# Patient Record
Sex: Female | Born: 1960 | Race: White | Hispanic: No | Marital: Married | Smoking: Current every day smoker
Health system: Southern US, Community
[De-identification: ages and names within clinical notes are randomized; demographics above are authoritative.]

## PROBLEM LIST (undated history)

## (undated) DIAGNOSIS — T7840XA Allergy, unspecified, initial encounter: Secondary | ICD-10-CM

## (undated) DIAGNOSIS — E785 Hyperlipidemia, unspecified: Secondary | ICD-10-CM

## (undated) DIAGNOSIS — F17200 Nicotine dependence, unspecified, uncomplicated: Secondary | ICD-10-CM

## (undated) DIAGNOSIS — I10 Essential (primary) hypertension: Secondary | ICD-10-CM

## (undated) DIAGNOSIS — Z8679 Personal history of other diseases of the circulatory system: Secondary | ICD-10-CM

## (undated) DIAGNOSIS — N200 Calculus of kidney: Secondary | ICD-10-CM

## (undated) HISTORY — DX: Hyperlipidemia, unspecified: E78.5

## (undated) HISTORY — DX: Essential (primary) hypertension: I10

## (undated) HISTORY — PX: INDUCED ABORTION: SHX677

## (undated) HISTORY — PX: ENDOMETRIAL ABLATION: SHX621

## (undated) HISTORY — DX: Allergy, unspecified, initial encounter: T78.40XA

## (undated) HISTORY — PX: OTHER SURGICAL HISTORY: SHX169

## (undated) HISTORY — DX: Personal history of other diseases of the circulatory system: Z86.79

## (undated) HISTORY — DX: Nicotine dependence, unspecified, uncomplicated: F17.200

---

## 1998-07-13 ENCOUNTER — Emergency Department (HOSPITAL_COMMUNITY): Admission: EM | Admit: 1998-07-13 | Discharge: 1998-07-13 | Payer: Self-pay | Admitting: Emergency Medicine

## 1999-06-15 ENCOUNTER — Encounter: Payer: Self-pay | Admitting: Internal Medicine

## 1999-06-15 ENCOUNTER — Emergency Department (HOSPITAL_COMMUNITY): Admission: EM | Admit: 1999-06-15 | Discharge: 1999-06-16 | Payer: Self-pay | Admitting: Internal Medicine

## 2000-12-25 ENCOUNTER — Emergency Department (HOSPITAL_COMMUNITY): Admission: EM | Admit: 2000-12-25 | Discharge: 2000-12-25 | Payer: Self-pay | Admitting: Emergency Medicine

## 2000-12-25 ENCOUNTER — Encounter: Payer: Self-pay | Admitting: Emergency Medicine

## 2002-02-13 ENCOUNTER — Ambulatory Visit (HOSPITAL_COMMUNITY): Admission: RE | Admit: 2002-02-13 | Discharge: 2002-02-13 | Payer: Self-pay | Admitting: Internal Medicine

## 2002-03-15 ENCOUNTER — Encounter: Payer: Self-pay | Admitting: Emergency Medicine

## 2002-03-15 ENCOUNTER — Emergency Department (HOSPITAL_COMMUNITY): Admission: EM | Admit: 2002-03-15 | Discharge: 2002-03-15 | Payer: Self-pay | Admitting: Emergency Medicine

## 2003-03-18 ENCOUNTER — Ambulatory Visit (HOSPITAL_COMMUNITY): Admission: RE | Admit: 2003-03-18 | Discharge: 2003-03-18 | Payer: Self-pay | Admitting: Family Medicine

## 2004-06-02 ENCOUNTER — Ambulatory Visit (HOSPITAL_COMMUNITY): Admission: RE | Admit: 2004-06-02 | Discharge: 2004-06-02 | Payer: Self-pay | Admitting: Family Medicine

## 2004-06-26 ENCOUNTER — Emergency Department (HOSPITAL_COMMUNITY): Admission: EM | Admit: 2004-06-26 | Discharge: 2004-06-26 | Payer: Self-pay | Admitting: Emergency Medicine

## 2004-07-14 ENCOUNTER — Ambulatory Visit: Payer: Self-pay | Admitting: Family Medicine

## 2004-10-06 ENCOUNTER — Ambulatory Visit: Payer: Self-pay | Admitting: Family Medicine

## 2004-10-18 DIAGNOSIS — Z8679 Personal history of other diseases of the circulatory system: Secondary | ICD-10-CM

## 2004-10-18 HISTORY — PX: TUBAL LIGATION: SHX77

## 2004-10-18 HISTORY — DX: Personal history of other diseases of the circulatory system: Z86.79

## 2005-02-05 ENCOUNTER — Ambulatory Visit (HOSPITAL_COMMUNITY): Admission: RE | Admit: 2005-02-05 | Discharge: 2005-02-05 | Payer: Self-pay | Admitting: Obstetrics & Gynecology

## 2005-03-04 ENCOUNTER — Ambulatory Visit: Payer: Self-pay | Admitting: Family Medicine

## 2005-03-08 ENCOUNTER — Ambulatory Visit: Payer: Self-pay | Admitting: *Deleted

## 2005-03-12 ENCOUNTER — Ambulatory Visit: Payer: Self-pay | Admitting: Cardiology

## 2005-03-22 ENCOUNTER — Ambulatory Visit: Payer: Self-pay

## 2005-03-24 ENCOUNTER — Ambulatory Visit: Payer: Self-pay | Admitting: Cardiology

## 2005-04-26 ENCOUNTER — Ambulatory Visit: Payer: Self-pay | Admitting: Family Medicine

## 2005-06-15 ENCOUNTER — Ambulatory Visit (HOSPITAL_COMMUNITY): Admission: RE | Admit: 2005-06-15 | Discharge: 2005-06-15 | Payer: Self-pay | Admitting: Family Medicine

## 2005-08-06 ENCOUNTER — Ambulatory Visit: Payer: Self-pay | Admitting: Family Medicine

## 2005-09-27 ENCOUNTER — Ambulatory Visit: Payer: Self-pay | Admitting: Family Medicine

## 2005-10-19 ENCOUNTER — Ambulatory Visit: Payer: Self-pay | Admitting: Nurse Practitioner

## 2006-02-18 ENCOUNTER — Ambulatory Visit: Payer: Self-pay | Admitting: Family Medicine

## 2006-02-21 ENCOUNTER — Ambulatory Visit (HOSPITAL_COMMUNITY): Admission: RE | Admit: 2006-02-21 | Discharge: 2006-02-21 | Payer: Self-pay | Admitting: Internal Medicine

## 2006-04-07 ENCOUNTER — Ambulatory Visit: Payer: Self-pay | Admitting: Family Medicine

## 2006-07-04 ENCOUNTER — Ambulatory Visit (HOSPITAL_COMMUNITY): Admission: RE | Admit: 2006-07-04 | Discharge: 2006-07-04 | Payer: Self-pay | Admitting: Internal Medicine

## 2006-07-09 ENCOUNTER — Emergency Department (HOSPITAL_COMMUNITY): Admission: EM | Admit: 2006-07-09 | Discharge: 2006-07-10 | Payer: Self-pay | Admitting: Emergency Medicine

## 2006-08-11 ENCOUNTER — Ambulatory Visit: Payer: Self-pay | Admitting: Family Medicine

## 2006-10-19 ENCOUNTER — Emergency Department (HOSPITAL_COMMUNITY): Admission: EM | Admit: 2006-10-19 | Discharge: 2006-10-19 | Payer: Self-pay | Admitting: Emergency Medicine

## 2007-02-09 ENCOUNTER — Ambulatory Visit: Payer: Self-pay | Admitting: Family Medicine

## 2007-04-01 ENCOUNTER — Emergency Department (HOSPITAL_COMMUNITY): Admission: EM | Admit: 2007-04-01 | Discharge: 2007-04-02 | Payer: Self-pay | Admitting: Emergency Medicine

## 2007-04-13 ENCOUNTER — Ambulatory Visit: Payer: Self-pay | Admitting: Family Medicine

## 2007-04-13 ENCOUNTER — Encounter (INDEPENDENT_AMBULATORY_CARE_PROVIDER_SITE_OTHER): Payer: Self-pay | Admitting: Family Medicine

## 2007-04-27 ENCOUNTER — Ambulatory Visit (HOSPITAL_COMMUNITY): Admission: RE | Admit: 2007-04-27 | Discharge: 2007-04-27 | Payer: Self-pay | Admitting: Urology

## 2007-06-21 ENCOUNTER — Ambulatory Visit: Payer: Self-pay | Admitting: Internal Medicine

## 2007-06-21 LAB — CONVERTED CEMR LAB
Basophils Absolute: 0 10*3/uL (ref 0.0–0.1)
Basophils Relative: 1 % (ref 0–1)
HCT: 45.1 % (ref 36.0–46.0)
Hemoglobin: 15 g/dL (ref 12.0–15.0)
MCHC: 33.3 g/dL (ref 30.0–36.0)
Neutro Abs: 4.2 10*3/uL (ref 1.7–7.7)
RDW: 13.3 % (ref 11.5–14.0)
WBC: 6.3 10*3/uL (ref 4.0–10.5)

## 2007-07-05 ENCOUNTER — Encounter (INDEPENDENT_AMBULATORY_CARE_PROVIDER_SITE_OTHER): Payer: Self-pay | Admitting: *Deleted

## 2007-07-06 ENCOUNTER — Ambulatory Visit (HOSPITAL_COMMUNITY): Admission: RE | Admit: 2007-07-06 | Discharge: 2007-07-06 | Payer: Self-pay | Admitting: Family Medicine

## 2007-07-26 ENCOUNTER — Ambulatory Visit (HOSPITAL_COMMUNITY): Admission: RE | Admit: 2007-07-26 | Discharge: 2007-07-26 | Payer: Self-pay | Admitting: Urology

## 2007-08-03 ENCOUNTER — Ambulatory Visit: Payer: Self-pay | Admitting: Internal Medicine

## 2007-08-03 LAB — CONVERTED CEMR LAB
ALT: 15 units/L (ref 0–35)
AST: 14 units/L (ref 0–37)
Alkaline Phosphatase: 52 units/L (ref 39–117)
BUN: 11 mg/dL (ref 6–23)
CO2: 23 meq/L (ref 19–32)
Chloride: 107 meq/L (ref 96–112)
Glucose, Bld: 87 mg/dL (ref 70–99)
Potassium: 4.4 meq/L (ref 3.5–5.3)
Sodium: 142 meq/L (ref 135–145)
Total Bilirubin: 0.6 mg/dL (ref 0.3–1.2)
VLDL: 42 mg/dL — ABNORMAL HIGH (ref 0–40)

## 2007-08-15 ENCOUNTER — Ambulatory Visit: Payer: Self-pay | Admitting: Internal Medicine

## 2007-08-30 ENCOUNTER — Ambulatory Visit: Payer: Self-pay | Admitting: Internal Medicine

## 2007-10-18 ENCOUNTER — Ambulatory Visit: Payer: Self-pay | Admitting: Internal Medicine

## 2007-11-28 ENCOUNTER — Ambulatory Visit: Payer: Self-pay | Admitting: Internal Medicine

## 2007-12-05 ENCOUNTER — Ambulatory Visit (HOSPITAL_COMMUNITY): Admission: RE | Admit: 2007-12-05 | Discharge: 2007-12-05 | Payer: Self-pay | Admitting: Family Medicine

## 2007-12-26 ENCOUNTER — Ambulatory Visit (HOSPITAL_COMMUNITY): Admission: RE | Admit: 2007-12-26 | Discharge: 2007-12-26 | Payer: Self-pay | Admitting: Family Medicine

## 2008-01-17 ENCOUNTER — Ambulatory Visit: Payer: Self-pay | Admitting: Internal Medicine

## 2008-01-17 LAB — CONVERTED CEMR LAB
ALT: 20 units/L (ref 0–35)
AST: 17 units/L (ref 0–37)
Albumin: 4.7 g/dL (ref 3.5–5.2)
Alkaline Phosphatase: 71 units/L (ref 39–117)
BUN: 13 mg/dL (ref 6–23)
Calcium: 9.5 mg/dL (ref 8.4–10.5)
Chloride: 107 meq/L (ref 96–112)
Creatinine, Ser: 0.73 mg/dL (ref 0.40–1.20)
Glucose, Bld: 81 mg/dL (ref 70–99)
Lymphocytes Relative: 27 % (ref 12–46)
Lymphs Abs: 2.1 10*3/uL (ref 0.7–4.0)
MCHC: 33.9 g/dL (ref 30.0–36.0)
MCV: 102.7 fL — ABNORMAL HIGH (ref 78.0–100.0)
Neutro Abs: 5 10*3/uL (ref 1.7–7.7)
Neutrophils Relative %: 65 % (ref 43–77)
Platelets: 258 10*3/uL (ref 150–400)
RBC: 4.8 M/uL (ref 3.87–5.11)
RDW: 12.9 % (ref 11.5–15.5)
Sodium: 141 meq/L (ref 135–145)
Total Bilirubin: 0.5 mg/dL (ref 0.3–1.2)

## 2008-02-22 ENCOUNTER — Ambulatory Visit: Payer: Self-pay | Admitting: Internal Medicine

## 2008-02-22 DIAGNOSIS — F17200 Nicotine dependence, unspecified, uncomplicated: Secondary | ICD-10-CM | POA: Insufficient documentation

## 2008-02-22 DIAGNOSIS — I10 Essential (primary) hypertension: Secondary | ICD-10-CM

## 2008-02-22 DIAGNOSIS — K8020 Calculus of gallbladder without cholecystitis without obstruction: Secondary | ICD-10-CM | POA: Insufficient documentation

## 2008-02-22 DIAGNOSIS — E785 Hyperlipidemia, unspecified: Secondary | ICD-10-CM | POA: Insufficient documentation

## 2008-02-22 HISTORY — DX: Hyperlipidemia, unspecified: E78.5

## 2008-02-22 HISTORY — DX: Essential (primary) hypertension: I10

## 2008-03-01 LAB — CONVERTED CEMR LAB
ALT: 20 units/L (ref 0–35)
Alkaline Phosphatase: 66 units/L (ref 39–117)
CO2: 29 meq/L (ref 19–32)
Calcium: 9.3 mg/dL (ref 8.4–10.5)
GFR calc non Af Amer: 72 mL/min
Glucose, Bld: 91 mg/dL (ref 70–99)
TSH: 1.74 microintl units/mL (ref 0.35–5.50)
VLDL: 56 mg/dL — ABNORMAL HIGH (ref 0–40)

## 2008-03-14 ENCOUNTER — Ambulatory Visit: Payer: Self-pay | Admitting: Internal Medicine

## 2008-03-18 ENCOUNTER — Telehealth: Payer: Self-pay | Admitting: Internal Medicine

## 2008-03-20 ENCOUNTER — Ambulatory Visit: Payer: Self-pay | Admitting: Internal Medicine

## 2008-03-21 ENCOUNTER — Ambulatory Visit: Payer: Self-pay | Admitting: Internal Medicine

## 2008-03-21 ENCOUNTER — Ambulatory Visit: Payer: Self-pay | Admitting: Obstetrics and Gynecology

## 2008-03-21 ENCOUNTER — Other Ambulatory Visit: Admission: RE | Admit: 2008-03-21 | Discharge: 2008-03-21 | Payer: Self-pay | Admitting: Obstetrics and Gynecology

## 2008-03-27 ENCOUNTER — Ambulatory Visit: Payer: Self-pay | Admitting: Internal Medicine

## 2008-03-28 ENCOUNTER — Ambulatory Visit (HOSPITAL_COMMUNITY): Admission: RE | Admit: 2008-03-28 | Discharge: 2008-03-28 | Payer: Self-pay | Admitting: Family Medicine

## 2008-03-28 ENCOUNTER — Encounter: Payer: Self-pay | Admitting: Internal Medicine

## 2008-04-04 ENCOUNTER — Ambulatory Visit: Payer: Self-pay | Admitting: Obstetrics & Gynecology

## 2008-04-26 ENCOUNTER — Ambulatory Visit: Payer: Self-pay | Admitting: Internal Medicine

## 2008-05-09 ENCOUNTER — Ambulatory Visit (HOSPITAL_COMMUNITY): Admission: RE | Admit: 2008-05-09 | Discharge: 2008-05-09 | Payer: Self-pay | Admitting: Obstetrics & Gynecology

## 2008-05-09 ENCOUNTER — Ambulatory Visit: Payer: Self-pay | Admitting: Obstetrics & Gynecology

## 2008-05-29 ENCOUNTER — Telehealth: Payer: Self-pay | Admitting: Internal Medicine

## 2008-06-06 ENCOUNTER — Ambulatory Visit: Payer: Self-pay | Admitting: Obstetrics & Gynecology

## 2008-06-20 ENCOUNTER — Telehealth: Payer: Self-pay | Admitting: Internal Medicine

## 2008-06-26 ENCOUNTER — Ambulatory Visit: Payer: Self-pay | Admitting: Internal Medicine

## 2008-07-11 ENCOUNTER — Ambulatory Visit (HOSPITAL_COMMUNITY): Admission: RE | Admit: 2008-07-11 | Discharge: 2008-07-11 | Payer: Self-pay | Admitting: Internal Medicine

## 2008-07-24 ENCOUNTER — Telehealth: Payer: Self-pay | Admitting: Internal Medicine

## 2008-09-04 ENCOUNTER — Ambulatory Visit: Payer: Self-pay | Admitting: Internal Medicine

## 2008-09-05 ENCOUNTER — Ambulatory Visit: Payer: Self-pay | Admitting: Internal Medicine

## 2008-10-02 ENCOUNTER — Telehealth: Payer: Self-pay | Admitting: Internal Medicine

## 2008-10-10 ENCOUNTER — Ambulatory Visit: Payer: Self-pay | Admitting: Internal Medicine

## 2008-10-10 DIAGNOSIS — F411 Generalized anxiety disorder: Secondary | ICD-10-CM | POA: Insufficient documentation

## 2008-11-25 ENCOUNTER — Ambulatory Visit: Payer: Self-pay | Admitting: Internal Medicine

## 2008-12-20 ENCOUNTER — Ambulatory Visit: Payer: Self-pay | Admitting: Internal Medicine

## 2009-01-16 ENCOUNTER — Ambulatory Visit: Payer: Self-pay | Admitting: Internal Medicine

## 2009-01-30 ENCOUNTER — Telehealth: Payer: Self-pay | Admitting: Internal Medicine

## 2009-03-14 ENCOUNTER — Ambulatory Visit: Payer: Self-pay | Admitting: Internal Medicine

## 2009-03-14 LAB — CONVERTED CEMR LAB
ALT: 16 units/L (ref 0–35)
AST: 22 units/L (ref 0–37)
Albumin: 4.1 g/dL (ref 3.5–5.2)
Basophils Absolute: 0 10*3/uL (ref 0.0–0.1)
Bilirubin Urine: NEGATIVE
Bilirubin, Direct: 0.2 mg/dL (ref 0.0–0.3)
Chloride: 114 meq/L — ABNORMAL HIGH (ref 96–112)
Cholesterol: 211 mg/dL — ABNORMAL HIGH (ref 0–200)
Creatinine, Ser: 0.8 mg/dL (ref 0.4–1.2)
GFR calc non Af Amer: 82.04 mL/min (ref >60)
Monocytes Absolute: 0.3 10*3/uL (ref 0.1–1.0)
Monocytes Relative: 5.2 % (ref 3.0–12.0)
Neutro Abs: 3.8 10*3/uL (ref 1.4–7.7)
Neutrophils Relative %: 62.1 % (ref 43.0–77.0)
Nitrite: NEGATIVE
Platelets: 204 10*3/uL (ref 150.0–400.0)
RBC: 4.45 M/uL (ref 3.87–5.11)
RDW: 11.8 % (ref 11.5–14.6)
TSH: 1.83 microintl units/mL (ref 0.35–5.50)
Total Bilirubin: 1.1 mg/dL (ref 0.3–1.2)
Total Protein: 7 g/dL (ref 6.0–8.3)
Triglycerides: 180 mg/dL — ABNORMAL HIGH (ref 0.0–149.0)
Urobilinogen, UA: 0.2

## 2009-04-07 ENCOUNTER — Other Ambulatory Visit: Admission: RE | Admit: 2009-04-07 | Discharge: 2009-04-07 | Payer: Self-pay | Admitting: Internal Medicine

## 2009-04-07 ENCOUNTER — Encounter: Payer: Self-pay | Admitting: Internal Medicine

## 2009-04-07 ENCOUNTER — Ambulatory Visit: Payer: Self-pay | Admitting: Internal Medicine

## 2009-06-19 ENCOUNTER — Telehealth: Payer: Self-pay | Admitting: Internal Medicine

## 2009-06-20 ENCOUNTER — Ambulatory Visit: Payer: Self-pay | Admitting: Family Medicine

## 2009-06-22 ENCOUNTER — Emergency Department (HOSPITAL_BASED_OUTPATIENT_CLINIC_OR_DEPARTMENT_OTHER): Admission: EM | Admit: 2009-06-22 | Discharge: 2009-06-22 | Payer: Self-pay | Admitting: Emergency Medicine

## 2009-06-24 ENCOUNTER — Telehealth (INDEPENDENT_AMBULATORY_CARE_PROVIDER_SITE_OTHER): Payer: Self-pay | Admitting: *Deleted

## 2009-06-27 ENCOUNTER — Encounter: Payer: Self-pay | Admitting: Internal Medicine

## 2009-07-22 ENCOUNTER — Ambulatory Visit (HOSPITAL_COMMUNITY): Admission: RE | Admit: 2009-07-22 | Discharge: 2009-07-22 | Payer: Self-pay | Admitting: Internal Medicine

## 2009-08-05 ENCOUNTER — Encounter (INDEPENDENT_AMBULATORY_CARE_PROVIDER_SITE_OTHER): Payer: Self-pay | Admitting: *Deleted

## 2009-08-06 ENCOUNTER — Ambulatory Visit: Payer: Self-pay | Admitting: Internal Medicine

## 2009-08-13 ENCOUNTER — Telehealth: Payer: Self-pay | Admitting: Internal Medicine

## 2009-10-18 HISTORY — PX: KIDNEY STONE SURGERY: SHX686

## 2009-11-03 ENCOUNTER — Telehealth: Payer: Self-pay | Admitting: Internal Medicine

## 2010-01-28 ENCOUNTER — Telehealth: Payer: Self-pay | Admitting: Internal Medicine

## 2010-01-29 ENCOUNTER — Ambulatory Visit: Payer: Self-pay | Admitting: Internal Medicine

## 2010-02-02 ENCOUNTER — Telehealth: Payer: Self-pay | Admitting: Internal Medicine

## 2010-02-10 ENCOUNTER — Telehealth: Payer: Self-pay | Admitting: Internal Medicine

## 2010-02-18 ENCOUNTER — Telehealth: Payer: Self-pay | Admitting: Internal Medicine

## 2010-02-18 ENCOUNTER — Ambulatory Visit: Payer: Self-pay | Admitting: Diagnostic Radiology

## 2010-02-18 ENCOUNTER — Emergency Department (HOSPITAL_BASED_OUTPATIENT_CLINIC_OR_DEPARTMENT_OTHER): Admission: EM | Admit: 2010-02-18 | Discharge: 2010-02-18 | Payer: Self-pay | Admitting: Emergency Medicine

## 2010-02-26 ENCOUNTER — Ambulatory Visit: Payer: Self-pay | Admitting: Internal Medicine

## 2010-02-26 DIAGNOSIS — N200 Calculus of kidney: Secondary | ICD-10-CM | POA: Insufficient documentation

## 2010-03-20 ENCOUNTER — Ambulatory Visit: Payer: Self-pay | Admitting: Family Medicine

## 2010-03-20 LAB — CONVERTED CEMR LAB
Ketones, urine, test strip: NEGATIVE
Nitrite: NEGATIVE
Specific Gravity, Urine: 1.025
Urobilinogen, UA: 0.2

## 2010-03-31 ENCOUNTER — Telehealth: Payer: Self-pay | Admitting: Internal Medicine

## 2010-04-02 ENCOUNTER — Ambulatory Visit: Payer: Self-pay | Admitting: Family Medicine

## 2010-04-02 DIAGNOSIS — R310 Gross hematuria: Secondary | ICD-10-CM | POA: Insufficient documentation

## 2010-04-02 LAB — CONVERTED CEMR LAB
Glucose, Urine, Semiquant: NEGATIVE
Ketones, urine, test strip: NEGATIVE
Specific Gravity, Urine: 1.01
Urobilinogen, UA: 0.2

## 2010-04-03 ENCOUNTER — Encounter: Payer: Self-pay | Admitting: Internal Medicine

## 2010-04-06 ENCOUNTER — Telehealth: Payer: Self-pay | Admitting: Internal Medicine

## 2010-04-07 ENCOUNTER — Telehealth: Payer: Self-pay | Admitting: Internal Medicine

## 2010-04-09 ENCOUNTER — Encounter: Payer: Self-pay | Admitting: Internal Medicine

## 2010-06-17 ENCOUNTER — Encounter: Payer: Self-pay | Admitting: Internal Medicine

## 2010-07-28 ENCOUNTER — Ambulatory Visit: Payer: Self-pay | Admitting: Family Medicine

## 2010-07-28 ENCOUNTER — Telehealth (INDEPENDENT_AMBULATORY_CARE_PROVIDER_SITE_OTHER): Payer: Self-pay | Admitting: *Deleted

## 2010-07-28 LAB — CONVERTED CEMR LAB
Bilirubin Urine: NEGATIVE
Glucose, Urine, Semiquant: NEGATIVE
Protein, U semiquant: NEGATIVE

## 2010-08-11 ENCOUNTER — Ambulatory Visit (HOSPITAL_COMMUNITY)
Admission: RE | Admit: 2010-08-11 | Discharge: 2010-08-11 | Payer: Self-pay | Source: Home / Self Care | Admitting: Internal Medicine

## 2010-08-27 ENCOUNTER — Ambulatory Visit: Payer: Self-pay | Admitting: Internal Medicine

## 2010-09-15 ENCOUNTER — Encounter: Payer: Self-pay | Admitting: Internal Medicine

## 2010-10-07 ENCOUNTER — Ambulatory Visit: Payer: Self-pay | Admitting: Internal Medicine

## 2010-11-09 ENCOUNTER — Encounter: Payer: Self-pay | Admitting: Internal Medicine

## 2010-11-19 NOTE — Assessment & Plan Note (Signed)
Summary: hematuria//ccm   Vital Signs:  Patient profile:   50 year old female Menstrual status:  regular Temp:     97.8 degrees F oral BP sitting:   148 / 98  (left arm) Cuff size:   regular  Vitals Entered By: Sid Falcon LPN (April 02, 2010 8:31 AM) CC: hematuria   History of Present Illness: Patient seen with episode of gross hematuria yesterday but none today. History recurrent kidney stones. Reportedly passed a couple stones early May of last month. Denies any back pain or any burning with urination or urine frequency. She is not on her menses. Reportedly had x-rays last month which did not show any persistent stones. She has had prior cystoscopy and imaging of her kidneys per urology.  no appetite or weight changes.  Allergies: 1)  ! * Chantix  Past History:  Past Medical History: Last updated: 01/16/2009 Hyperlipidemia Hypertension Nephrolithiasis, hx of Anxiety Headache  Review of Systems  The patient denies anorexia, fever, weight loss, and incontinence.    Physical Exam  General:  Well-developed,well-nourished,in no acute distress; alert,appropriate and cooperative throughout examination Lungs:  Normal respiratory effort, chest expands symmetrically. Lungs are clear to auscultation, no crackles or wheezes. Heart:  Normal rate and regular rhythm. S1 and S2 normal without gallop, murmur, click, rub or other extra sounds.   Impression & Recommendations:  Problem # 1:  GROSS HEMATURIA (ICD-599.71)  obtain urine culture. If further episodes of gross hematuria will need repeat urologic assessment.  Orders: UA Dipstick w/o Micro (manual) (04540) T-Culture, Urine ([id number])  Complete Medication List: 1)  Metoprolol Tartrate 25 Mg Tabs (Metoprolol tartrate) .... Take 1 tablet by mouth two times a day 2)  Adult Aspirin Ec Low Strength 81 Mg Tbec (Aspirin) .... One by mouth daily 3)  Promethazine Hcl 25 Mg Tabs (Promethazine hcl) .... Take 1 tablet by mouth  once a day as needed nausea 4)  No Narcotics!!!  5)  B Complex Tabs (B complex vitamins) .... Once daily 6)  Clonazepam 0.5 Mg Tabs (Clonazepam) .... Take one tablet by mouth every other day as needed anxiety 7)  Budeprion Sr 150 Mg Tb12 (Bupropion hcl) .... Take one by mouth tid 8)  Hydrocodone-acetaminophen 10-325 Mg Tabs (Hydrocodone-acetaminophen) .... One by mouth q 6 hours as needed severe pain  Patient Instructions: 1)  followup if you have any recurrent episodes of gross blood in urine or if you develop any new symptoms such as fever or chills follow up promptly. 2)  We will call you in the next few days regarding urine culture results  Laboratory Results   Urine Tests    Routine Urinalysis   Color: yellow Appearance: Hazy Glucose: negative   (Normal Range: Negative) Bilirubin: negative   (Normal Range: Negative) Ketone: negative   (Normal Range: Negative) Spec. Gravity: 1.010   (Normal Range: 1.003-1.035) Blood: large   (Normal Range: Negative) pH: 6.0   (Normal Range: 5.0-8.0) Protein: negative   (Normal Range: Negative) Urobilinogen: 0.2   (Normal Range: 0-1) Nitrite: negative   (Normal Range: Negative) Leukocyte Esterace: small   (Normal Range: Negative)    Comments: Sid Falcon LPN  April 02, 2010 9:08 AM

## 2010-11-19 NOTE — Progress Notes (Signed)
Summary: question  Phone Note Call from Patient Call back at Home Phone [phone]   Caller: Patient via VM Call For: Birdie Sons MD Summary of Call: Since culture is negative, wondering what to do now. Initial call taken by: Gladis Riffle, RN,  April 07, 2010 7:53 AM  Follow-up for Phone Call        if continued to be bothered refer to urolgy Follow-up by: Birdie Sons MD,  April 08, 2010 12:01 PM  Additional Follow-up for Phone Call Additional follow up Details #1::        Patient notified. She is concerned as father had bladder cancer.  Sees Dr Phoebe Perch so will make own ov with them. Additional Follow-up by: Gladis Riffle, RN,  April 08, 2010 2:24 PM

## 2010-11-19 NOTE — Assessment & Plan Note (Signed)
Summary: follow up/cjr   Vital Signs:  Patient profile:   50 year old female Menstrual status:  regular Weight:      139 pounds Temp:     98.3 degrees F oral Pulse rate:   72 / minute Pulse rhythm:   regular BP sitting:   112 / 80  (left arm) Cuff size:   regular  Vitals Entered By: Alfred Levins, CMA (October 07, 2010 10:44 AM) CC: f/u   CC:  f/u.  History of Present Illness:  Follow-Up Visit      This is a 50 year old woman who presents for Follow-up visit.  The patient denies chest pain and palpitations.  Since the last visit the patient notes no new problems or concerns.  The patient reports taking meds as prescribed and monitoring BP.  When questioned about possible medication side effects, the patient notes none.  Home BPs 104/62- 122/74 tolerating meds without difficulty  All other systems reviewed and were negative   Current Medications (verified): 1)  Metoprolol Tartrate 50 Mg Tabs (Metoprolol Tartrate) .... Take 1 Tab By Mouth Two Times Per Day 2)  Adult Aspirin Ec Low Strength 81 Mg  Tbec (Aspirin) .... One By Mouth Daily 3)  Calcium 600-200 Mg-Unit Tabs (Calcium-Vitamin D) .... Once Daily 4)  Hydrochlorothiazide 12.5 Mg  Tabs (Hydrochlorothiazide) .... Take 1 Tab  By Mouth Every Morning 5)  Klor-Con 10 10 Meq Cr-Tabs (Potassium Chloride) .... Once Daily 6)  Fluticasone Propionate 50 Mcg/act  Susp (Fluticasone Propionate) .... 2 Sprays Each Nostril Once Daily 7)  Lipo-Flavonoid Plus  Tabs (Vitamins-Lipotropics) .... Three Times A Day  Allergies (verified): 1)  ! * Chantix  Past History:  Past Medical History: Last updated: 07/28/2010 Hyperlipidemia Hypertension Nephrolithiasis, hx of. Sees Dr. Ihor Gully Anxiety Headache  Past Surgical History: Last updated: 07/28/2010 Tubal ligation--2006 abortion age 33 right percutaneous kidney stone removal 11-2009 at [facility]  Family History: Last updated: Apr 10, 2009 mother deceased---cirrhosis-non  alcoholic-62 yo father deceased---bladder CA-85 yo 1/2 sister with Breast CA  Social History: Last updated: 02/22/2008 Occupation: self employed day care Single 2 kids---healthy Current Smoker Alcohol use-no Regular exercise-no  Risk Factors: Exercise: no (02/22/2008)  Risk Factors: Smoking Status: current (07/28/2010) Packs/Day: 0.75 (07/28/2010)  Physical Exam  General:  well-developed well-nourished female in no acute distress. HEENT exam atraumatic, normocephalic [facility] muscles are intact. Neck is supple without lymphadenopathy, thyromegaly, jugular venous distention. Chest clear to auscultation cardiac exam S1-S2 are regular. Abdominal exam active bowel sounds, soft. Extremities there is no cyanosis   Impression & Recommendations:  Problem # 1:  HYPERTENSION (ICD-401.9) Assessment Improved controlled continue current medications  Her updated medication list for this problem includes:    Metoprolol Tartrate 50 Mg Tabs (Metoprolol tartrate) .Marland Kitchen... Take 1 tab by mouth two times per day    Hydrochlorothiazide 12.5 Mg Tabs (Hydrochlorothiazide) .Marland Kitchen... Take 1 tab  by mouth every morning  BP today: 112/80 Prior BP: 164/98 (08/27/2010)  Labs Reviewed: K+: 4.1 (03/14/2009) Creat: : 0.8 (03/14/2009)   Chol: 211 (03/14/2009)   HDL: 30.50 (03/14/2009)   LDL: DEL (02/22/2008)   TG: 180.0 (03/14/2009)  Problem # 2:  HYPERLIPIDEMIA (ICD-272.4) no meds will need to recheck Labs Reviewed: SGOT: 22 (03/14/2009)   SGPT: 16 (03/14/2009)   HDL:30.50 (03/14/2009), 25.9 (02/22/2008)  LDL:DEL (02/22/2008), 137 (08/03/2007)  Chol:211 (03/14/2009), 140 (02/22/2008)  Trig:180.0 (03/14/2009), 279 (02/22/2008)  Complete Medication List: 1)  Metoprolol Tartrate 50 Mg Tabs (Metoprolol tartrate) .... Take 1 tab by  mouth two times per day 2)  Adult Aspirin Ec Low Strength 81 Mg Tbec (Aspirin) .... One by mouth daily 3)  Calcium 600-200 Mg-unit Tabs (Calcium-vitamin d) .... Once daily 4)   Hydrochlorothiazide 12.5 Mg Tabs (Hydrochlorothiazide) .... Take 1 tab  by mouth every morning 5)  Klor-con 10 10 Meq Cr-tabs (Potassium chloride) .... Once daily 6)  Fluticasone Propionate 50 Mcg/act Susp (Fluticasone propionate) .... 2 sprays each nostril once daily  Other Orders: Audiology (Audio)  Patient Instructions: 1)  Please schedule a follow-up appointment in 4 months.   Orders Added: 1)  Est. Patient Level III [16109] 2)  Audiology [Audio]

## 2010-11-19 NOTE — Assessment & Plan Note (Signed)
Summary: ? kidney stones//ccm   Vital Signs:  Patient profile:   50 year old female Menstrual status:  regular Temp:     98.2 degrees F oral BP sitting:   160 / 94  (left arm) Cuff size:   regular  Vitals Entered By: Sid Falcon LPN (March 20, 1609 2:47 PM) CC: ? kidney stone   History of Present Illness: Onset earlier today R flank pain.  Has seen urologists in past.  Hx large R staghorn calculus. ?passed around 12:30 today. No gross hematuria.  No burning.  No fever.  Pain earlier today sharp and intense.  Pain somewhat better now but still has some residual pain. Toradol has helped in past. Pt also has some nausea but no vomiting.  Allergies: 1)  ! * Chantix  Past History:  Past Medical History: Last updated: 01/16/2009 Hyperlipidemia Hypertension Nephrolithiasis, hx of Anxiety Headache  Review of Systems  The patient denies anorexia, fever, weight loss, abdominal pain, melena, hematochezia, severe indigestion/heartburn, hematuria, and incontinence.    Physical Exam  General:  Well-developed,well-nourished,in no acute distress; alert,appropriate and cooperative throughout examination Mouth:  sl dry otherwise clear. Lungs:  Normal respiratory effort, chest expands symmetrically. Lungs are clear to auscultation, no crackles or wheezes. Heart:  Normal rate and regular rhythm. S1 and S2 normal without gallop, murmur, click, rub or other extra sounds. Abdomen:  soft, normal bowel sounds, no distention, and no masses.  Slightly tender RLQ Msk:  no CVA tenderness. Neurologic:  alert & oriented X3.   Psych:  normally interactive, good eye contact, and not depressed appearing.     Impression & Recommendations:  Problem # 1:  NEPHROLITHIASIS (ICD-592.0) Assessment New  hematuria on dipstick.  ?recurrent nephrolithiasis.  Toradol 60 mg IM with phergan for nausea.  Limited hydrocodone for outpt pain relief for acute pain.  Urology follow up if pain  persists.  Orders: UA Dipstick w/o Micro (manual) (96045) Promethazine up to 50mg  (J2550) Ketorolac-Toradol 15mg  (W0981) Admin of Therapeutic Inj  intramuscular or subcutaneous (19147)  Complete Medication List: 1)  Metoprolol Tartrate 25 Mg Tabs (Metoprolol tartrate) .... Take 1 tablet by mouth two times a day 2)  Adult Aspirin Ec Low Strength 81 Mg Tbec (Aspirin) .... One by mouth daily 3)  Promethazine Hcl 25 Mg Tabs (Promethazine hcl) .... Take 1 tablet by mouth once a day as needed nausea 4)  No Narcotics!!!  5)  B Complex Tabs (B complex vitamins) .... Once daily 6)  Clonazepam 0.5 Mg Tabs (Clonazepam) .... Take one tablet by mouth every other day as needed anxiety 7)  Budeprion Sr 150 Mg Tb12 (Bupropion hcl) .... Take 1 tablet by mouth once a day for 7 days and then take 1 tablet by mouth two times a day 8)  Hydrocodone-acetaminophen 10-325 Mg Tabs (Hydrocodone-acetaminophen) .... One by mouth q 6 hours as needed severe pain  Patient Instructions: 1)  Follow up promptly for any worsening pain, fever, recurrent vomiting, or any other new symptoms. Prescriptions: HYDROCODONE-ACETAMINOPHEN 10-325 MG TABS (HYDROCODONE-ACETAMINOPHEN) one by mouth q 6 hours as needed severe pain  #20 x 0   Entered and Authorized by:   Evelena Peat MD   Signed by:   Evelena Peat MD on 03/20/2010   Method used:   Print then Give to Patient   RxID:   [id number]   Laboratory Results   Urine Tests    Routine Urinalysis   Color: yellow Appearance: Clear Glucose: negative   (Normal Range: Negative) Bilirubin:  negative   (Normal Range: Negative) Ketone: negative   (Normal Range: Negative) Spec. Gravity: 1.025   (Normal Range: 1.003-1.035) Blood: large   (Normal Range: Negative) pH: 6.0   (Normal Range: 5.0-8.0) Protein: negative   (Normal Range: Negative) Urobilinogen: 0.2   (Normal Range: 0-1) Nitrite: negative   (Normal Range: Negative) Leukocyte Esterace: negative   (Normal  Range: Negative)    Comments: Sid Falcon LPN  March 20, 1609 2:53 PM      Medication Administration  Injection # 1:    Medication: Promethazine up to 50mg     Diagnosis: NEPHROLITHIASIS (ICD-592.0)    Route: IM    Site: LUOQ gluteus    Exp Date: 08/19/2011    Lot #: [id number]    Mfr: Novaplus    Comments: 25 mg ordered and given    Patient tolerated injection without complications    Given by: Sid Falcon LPN (March 20, 980 3:19 PM)  Injection # 2:    Medication: Ketorolac-Toradol 15mg     Diagnosis: NEPHROLITHIASIS (ICD-592.0)    Route: IM    Site: RUOQ gluteus    Exp Date: 06/07/2011    Lot #: [id number]    Mfr: Novaplus    Comments: 60 mg given    Patient tolerated injection without complications    Given by: Sid Falcon LPN (March 20, 9561 3:22 PM)  Orders Added: 1)  UA Dipstick w/o Micro (manual) [81002] 2)  Promethazine up to 50mg  [J2550] 3)  Ketorolac-Toradol 15mg  [J1885] 4)  Admin of Therapeutic Inj  intramuscular or subcutaneous [96372] 5)  Est. Patient Level IV [13086]

## 2010-11-19 NOTE — Progress Notes (Signed)
Summary: Request to speak with Nurse  Phone Note Call from Patient Call back at Home Phone [phone]   Caller: Patient Summary of Call: Needs to speak with nurse in regards to keeping track of blood pressure readings. Initial call taken by: Trixie Dredge,  February 10, 2010 11:01 AM  Follow-up for Phone Call        BP 130s/70-80s at home with P 80-100.  Has appt 5/11.  Encouraged to keep appt and continue to monitor BP and bring readings to appt with her. Follow-up by: Gladis Riffle, RN,  February 10, 2010 11:50 AM

## 2010-11-19 NOTE — Assessment & Plan Note (Signed)
Summary: kidney stones/dm   Vital Signs:  Patient profile:   50 year old female Weight:      139 pounds O2 Sat:      96 % Temp:     98.3 degrees F Pulse rate:   97 / minute BP sitting:   120 / 80  (left arm)  Vitals Entered By: Pura Spice, RN (July 28, 2010 2:36 PM) CC: ?kdney stone    History of Present Illness: Here for the onset of blood in the urine several hours ago, and now with right flank pain. Some nausea but no vomitting. Appetite okay, no fever. She has ha hx of frequent kidney stones, and she was here in June with an episode. She has seen Dr. Vernie Ammons, and he referred her to [facility].   Preventive Screening-Counseling & Management  Alcohol-Tobacco     Smoking Status: current     Packs/Day: 0.75  Allergies: 1)  ! * Chantix  Past History:  Past Medical History: Hyperlipidemia Hypertension Nephrolithiasis, hx of. Sees Dr. Ihor Gully Anxiety Headache  Past Surgical History: Tubal ligation--2006 abortion age 64 right percutaneous kidney stone removal 11-2009 at [facility]  Social History: Packs/Day:  0.75  Review of Systems  The patient denies anorexia, fever, weight loss, weight gain, vision loss, decreased hearing, hoarseness, chest pain, syncope, dyspnea on exertion, peripheral edema, prolonged cough, headaches, hemoptysis, abdominal pain, melena, hematochezia, severe indigestion/heartburn, hematuria, incontinence, genital sores, muscle weakness, suspicious skin lesions, transient blindness, difficulty walking, depression, unusual weight change, abnormal bleeding, enlarged lymph nodes, angioedema, breast masses, and testicular masses.    Physical Exam  General:  in pain Lungs:  Normal respiratory effort, chest expands symmetrically. Lungs are clear to auscultation, no crackles or wheezes. Heart:  Normal rate and regular rhythm. S1 and S2 normal without gallop, murmur, click, rub or other extra sounds. Abdomen:  Bowel sounds  positive,abdomen soft and without masses, organomegaly or hernias noted. Very tender to percussion in the right mid back and right flank   Impression & Recommendations:  Problem # 1:  NEPHROLITHIASIS (ICD-592.0)  Orders: UA Dipstick w/o Micro (manual) (16109) Ketorolac-Toradol 15mg  (U0454) Admin of Therapeutic Inj  intramuscular or subcutaneous (09811)  Complete Medication List: 1)  Metoprolol Tartrate 25 Mg Tabs (Metoprolol tartrate) .... Take 1 tablet by mouth two times a day 2)  Adult Aspirin Ec Low Strength 81 Mg Tbec (Aspirin) .... One by mouth daily 3)  Promethazine Hcl 25 Mg Tabs (Promethazine hcl) .... Take 1 tablet by mouth once a day as needed nausea 4)  No Narcotics!!!  5)  B Complex Tabs (B complex vitamins) .... Once daily 6)  Clonazepam 0.5 Mg Tabs (Clonazepam) .... Take one tablet by mouth every other day as needed anxiety 7)  Budeprion Sr 150 Mg Tb12 (Bupropion hcl) .... Take one by mouth tid 8)  Hydrocodone-acetaminophen 10-325 Mg Tabs (Hydrocodone-acetaminophen) .... One by mouth q 6 hours as needed severe pain  Patient Instructions: 1)  given meds for pain. If she cannot pass this in the next 24 hours, she will see [facility]  Prescriptions: HYDROCODONE-ACETAMINOPHEN 10-325 MG TABS (HYDROCODONE-ACETAMINOPHEN) one by mouth q 6 hours as needed severe pain  #15 x 0   Entered and Authorized by:   Nelwyn Salisbury MD   Signed by:   Nelwyn Salisbury MD on 07/28/2010   Method used:   Print then Give to Patient   RxID:   [id number]   Laboratory Results   Urine Tests  Date/Time Received: July 28, 2010 2:38 PM  Date/Time Reported: 2:39 PM   Routine Urinalysis   Color: pink Appearance: Hazy Glucose: negative   (Normal Range: Negative) Bilirubin: negative   (Normal Range: Negative) Ketone: negative   (Normal Range: Negative) Spec. Gravity: <1.005   (Normal Range: 1.003-1.035) Blood: large   (Normal Range: Negative) pH: 5.0   (Normal Range:  5.0-8.0) Protein: negative   (Normal Range: Negative) Urobilinogen: 0.2   (Normal Range: 0-1) Nitrite: negative   (Normal Range: Negative) Leukocyte Esterace: negative   (Normal Range: Negative)    Comments: Pura Spice, RN  July 28, 2010 2:40 PM      Medication Administration  Injection # 1:    Medication: Ketorolac-Toradol 15mg     Diagnosis: NEPHROLITHIASIS (ICD-592.0)    Route: IM    Site: RUOQ gluteus    Exp Date: [id number]    Lot #: [id number]    Mfr: [email or web address]    Comments: 60 mg given     Patient tolerated injection without complications    Given by: Pura Spice, RN (July 28, 2010 3:39 PM)  Orders Added: 1)  Est. Patient Level IV [29562] 2)  UA Dipstick w/o Micro (manual) [81002] 3)  Ketorolac-Toradol 15mg  [J1885] 4)  Admin of Therapeutic Inj  intramuscular or subcutaneous [13086]

## 2010-11-19 NOTE — Progress Notes (Signed)
Summary: BP issues   Phone Note Call from Patient   Caller: Patient Summary of Call: pt called to report had panic attack last nite and went to fire dept for BP ck and stated 170/90 rt arm and 170/80 left arm and was told HR was irregular. they recommended her to the ER but declined due to finances.  states she took her HR and got 115  pls advise and call aat  [phone]  Initial call taken by: Pura Spice, RN,  January 28, 2010 4:33 PM  Follow-up for Phone Call        per Dr Fabian Sharp if heart rate goes over 120 chest pain or dizziness or feels faint call 911 and go to ER otherwise see Dr Cato Mulligan tomorrw  Follow-up by: Pura Spice, RN,  January 28, 2010 5:07 PM  Additional Follow-up for Phone Call Additional follow up Details #1::        Phone Call Completed Additional Follow-up by: Rudy Jew, RN,  January 28, 2010 5:14 PM

## 2010-11-19 NOTE — Progress Notes (Signed)
Summary: Pt is bleeding very heavy when urinating  Phone Note Call from Patient Call back at Home Phone [phone]   Caller: Patient Summary of Call: Pt called and said that she is bleeding when urinating. Blood flow is heavy. Pt is req appt asap. Pls call and let her know what to do.  Initial call taken by: Lucy Antigua,  July 28, 2010 11:44 AM  Follow-up for Phone Call        Appt scheduled with Dr. Clent Ridges. Follow-up by: Lynann Beaver CMA,  July 28, 2010 2:06 PM

## 2010-11-19 NOTE — Progress Notes (Signed)
Summary: off ASA for 1-31 ?percut  Phone Note Call from Patient Call back at Home Phone [phone]   Caller: vm Call For: Mathilde Mcwherter Summary of Call: Received a letter call premary physician to go off ASA for 2 weeks for 1-31 ? percut.  Today would be the day to go off.   Initial call taken by: Rudy Jew, RN,  November 03, 2009 12:37 PM  Follow-up for Phone Call        difficulty understanding mssg but if this is for surgery---ok to dc asa   Follow-up by: Birdie Sons MD,  November 03, 2009 3:46 PM  Additional Follow-up for Phone Call Additional follow up Details #1::        Phone Call Completed Additional Follow-up by: Rudy Jew, RN,  November 03, 2009 4:51 PM

## 2010-11-19 NOTE — Progress Notes (Signed)
Summary: pharmacy question  Phone Note From Pharmacy Call back at fax [phone]; phone [phone]   [facility] [address] [facility] Call For: Jazzy Parmer  Summary of Call: clonazepam 0.5mg  One tablet every other day as needed anxiety refill.  Last 02/20/09  #20 wo refills  Initial call taken by: Gladis Riffle, RN,  February 02, 2010 2:47 PM  Follow-up for Phone Call        was taken off med list 04/07/09 as regime completed.  Added back to list 01/29/10 as she had a prescription bottle.  Now wants refill. Follow-up by: Gladis Riffle, RN,  February 02, 2010 2:48 PM  Additional Follow-up for Phone Call Additional follow up Details #1::        ok #20 Additional Follow-up by: Birdie Sons MD,  February 02, 2010 3:46 PM    Prescriptions: CLONAZEPAM 0.5 MG TABS (CLONAZEPAM) Take one tablet by mouth every other day as needed anxiety  #20 x 0   Entered by:   Gladis Riffle, RN   Authorized by:   Birdie Sons MD   Signed by:   Gladis Riffle, RN on 02/02/2010   Method used:   Printed then faxed to ...       [facility]  [place] [address] (retail)       [address]       [place], [place]  [address]       Ph: [phone] or [phone]       Fax: [phone]   RxID:   [id number]

## 2010-11-19 NOTE — Assessment & Plan Note (Signed)
Summary: 6 month rov/njr   Vital Signs:  Patient profile:   50 year old female Menstrual status:  regular Weight:      138 pounds Temp:     98.9 degrees F oral Pulse rate:   88 / minute Pulse rhythm:   regular BP sitting:   164 / 98  (left arm) Cuff size:   regular  Vitals Entered By: Alfred Levins, CMA (August 27, 2010 9:55 AM) CC: f/u   CC:  f/u.  History of Present Illness:  Follow-Up Visit      This is a 50 year old woman who presents for Follow-up visit.  The patient denies chest pain and palpitations.  Since the last visit the patient notes no new problems or concerns and being seen by a specialist.  The patient reports taking meds as prescribed.  When questioned about possible medication side effects, the patient notes none.  seeing urology for recurrent kidney stone  All other systems reviewed and were negative   Current Problems (verified): 1)  Gross Hematuria  (ICD-599.71) 2)  Physical Examination  (ICD-V70.0) 3)  Anxiety  (ICD-300.00) 4)  Tobacco Use  (ICD-305.1) 5)  Cholelithiasis, Asymptomatic  (ICD-574.20) 6)  Nephrolithiasis  (ICD-592.0) 7)  Hypertension  (ICD-401.9) 8)  Hyperlipidemia  (ICD-272.4)  Current Medications (verified): 1)  Metoprolol Tartrate 25 Mg Tabs (Metoprolol Tartrate) .... Take 1 Tablet By Mouth Two Times A Day 2)  Adult Aspirin Ec Low Strength 81 Mg  Tbec (Aspirin) .... One By Mouth Daily 3)  Calcium 1500 Mg Tabs (Calcium Carbonate) .Marland Kitchen.. 1 By Mouth Once Daily 4)  Hydrochlorothiazide 12.5 Mg  Tabs (Hydrochlorothiazide) .... Take 1 Tab  By Mouth Every Morning 5)  Klor-Con 10 10 Meq Cr-Tabs (Potassium Chloride) .... Once Daily  Allergies (verified): 1)  ! * Chantix  Past History:  Past Medical History: Last updated: 07/28/2010 Hyperlipidemia Hypertension Nephrolithiasis, hx of. Sees Dr. Ihor Gully Anxiety Headache  Past Surgical History: Last updated: 07/28/2010 Tubal ligation--2006 abortion age 11 right  percutaneous kidney stone removal 11-2009 at [facility]  Family History: Last updated: 05-06-2009 mother deceased---cirrhosis-non alcoholic-62 yo father deceased---bladder CA-85 yo 1/2 sister with Breast CA  Social History: Last updated: 02/22/2008 Occupation: self employed day care Single 2 kids---healthy Current Smoker Alcohol use-no Regular exercise-no  Risk Factors: Exercise: no (02/22/2008)  Risk Factors: Smoking Status: current (07/28/2010) Packs/Day: 0.75 (07/28/2010)  Physical Exam  General:  well-developed well-nourished female in no acute distress. HEENT exam atraumatic, normocephalic [facility] muscles are intact. Neck is supple without lymphadenopathy, thyromegaly, jugular venous distention. Chest clear to auscultation cardiac exam S1-S2 are regular. Abdominal exam active bowel sounds, soft. Extremities there is no clubbing cyanosis or edema.   Impression & Recommendations:  Problem # 1:  HYPERLIPIDEMIA (ICD-272.4) not controlled check labs today Labs Reviewed: SGOT: 22 (03/14/2009)   SGPT: 16 (03/14/2009)   HDL:30.50 (03/14/2009), 25.9 (02/22/2008)  LDL:DEL (02/22/2008), 137 (08/03/2007)  Chol:211 (03/14/2009), 140 (02/22/2008)  Trig:180.0 (03/14/2009), 279 (02/22/2008)  Problem # 2:  TOBACCO USE (ICD-305.1) she is not interested in quitting Encouraged smoking cessation and discussed different methods for smoking cessation.   Problem # 3:  HYPERTENSION (ICD-401.9) not controlled she rarely checks BPs at home Her updated medication list for this problem includes:    Metoprolol Tartrate 25 Mg Tabs (Metoprolol tartrate) .Marland Kitchen... Take 1 tablet by mouth two times a day    Doxazosin Mesylate 4 Mg Tabs (Doxazosin mesylate) .Marland Kitchen... 1 by mouth once daily  Hydrochlorothiazide 12.5 Mg Tabs (Hydrochlorothiazide) .Marland Kitchen... Take 1 tab  by mouth every morning  BP today: 164/98 Prior BP: 120/80 (07/28/2010)  Labs Reviewed: K+: 4.1 (03/14/2009) Creat: : 0.8  (03/14/2009)   Chol: 211 (03/14/2009)   HDL: 30.50 (03/14/2009)   LDL: DEL (02/22/2008)   TG: 180.0 (03/14/2009)  Problem # 4:  CHOLELITHIASIS, ASYMPTOMATIC (ICD-574.20) no sxs  Complete Medication List: 1)  Metoprolol Tartrate 50 Mg Tabs (Metoprolol tartrate) .... Take 1 tab by mouth two times per day 2)  Adult Aspirin Ec Low Strength 81 Mg Tbec (Aspirin) .... One by mouth daily 3)  Calcium 1500 Mg Tabs (Calcium carbonate) .Marland Kitchen.. 1 by mouth once daily 4)  Hydrochlorothiazide 12.5 Mg Tabs (Hydrochlorothiazide) .... Take 1 tab  by mouth every morning 5)  Klor-con 10 10 Meq Cr-tabs (Potassium chloride) .... Once daily 6)  Fluticasone Propionate 50 Mcg/act Susp (Fluticasone propionate) .... 2 sprays each nostril once daily  Patient Instructions: 1)  Please schedule a follow-up appointment in 2 months. 2)  monitor your BP at home 3 times weekly. record them and bring the recording with you Prescriptions: FLUTICASONE PROPIONATE 50 MCG/ACT  SUSP (FLUTICASONE PROPIONATE) 2 sprays each nostril once daily  #1 vial x 3   Entered and Authorized by:   Birdie Sons MD   Signed by:   Birdie Sons MD on 08/27/2010   Method used:   [facility]  [place] [address]       [address]       [place], [place]  [address]       Ph: [phone] or [phone]       Fax: [phone]   RxID:   [id number] METOPROLOL TARTRATE 50 MG TABS (METOPROLOL TARTRATE) Take 1 tab by mouth two times per day  #180 x 3   Entered and Authorized by:   Birdie Sons MD   Signed by:   Birdie Sons MD on 08/27/2010   Method used:   [facility]        [facility]  [place] [address] [facility]       [address]       [place], [place]  [address]       Ph: [phone] or [phone]       Fax: [phone]   RxID:   [id number]    Orders Added: 1)  Est. Patient Level IV [57[address]]

## 2010-11-19 NOTE — Letter (Signed)
Summary: [facility]  [facility]   Imported By: Maryln Gottron 04/28/2010 13:59:16  _____________________________________________________________________  External Attachment:    Type:   Image     Comment:   External Document

## 2010-11-19 NOTE — Progress Notes (Signed)
Summary: Pt called req test results. Pls call asap.  Phone Note Call from Patient Call back at Home Phone [phone]   Caller: Patient Summary of Call: Pt called to get test results from culture. Pls call asap.  Initial call taken by: Lucy Antigua,  April 06, 2010 3:44 PM  Follow-up for Phone Call        Patient notified on voice mail. Follow-up by: Gladis Riffle, RN,  April 06, 2010 3:58 PM

## 2010-11-19 NOTE — Progress Notes (Signed)
Summary: increase Wellbutrin?  Phone Note Call from Patient   Caller: Patient Call For: Birdie Sons MD Summary of Call: Pt would like to increase the Wellbutrin dosage.......she is doing much better, but still has some urge to smoke. [phone]) Initial call taken by: Lynann Beaver CMA,  March 31, 2010 2:56 PM  Follow-up for Phone Call        can increase to 150mg  three times a day  Follow-up by: Birdie Sons MD,  March 31, 2010 3:28 PM    New/Updated Medications: BUDEPRION SR 150 MG TB12 (BUPROPION HCL) Take one by mouth tid Prescriptions: BUDEPRION SR 150 MG TB12 (BUPROPION HCL) Take one by mouth tid  #120 x 6   Entered by:   Lynann Beaver CMA   Authorized by:   Birdie Sons MD   Signed by:   Lynann Beaver CMA on 03/31/2010   Method used:   [facility]  [place] [address] [facility]       [address]       [place], [place]  [address]       Ph: [phone] or [phone]       Fax: [phone]   RxID:   [id number]  Pt. notified.

## 2010-11-19 NOTE — Progress Notes (Signed)
Summary: pain  Phone Note Call from Patient   Caller: Patient Summary of Call: In acute pain with a kidney stone, requesting ov with someone now.  Has 2 10mg  Percocet left & will have to have more pain med.  While on phone with her she said that she would have to have Dilaudid shot to break the pain & that she will go to ER.   Initial call taken by: Rudy Jew, RN,  Feb 18, 2010 1:07 PM

## 2010-11-19 NOTE — Assessment & Plan Note (Signed)
Summary: elevated bp per dr panosh/bmw   Vital Signs:  Patient profile:   50 year old female Menstrual status:  regular Weight:      136 pounds BMI:     24.57 Temp:     98.3 degrees F oral Pulse rate:   80 / minute Pulse rhythm:   regular Resp:     12 per minute BP sitting:   130 / 86  (left arm) Cuff size:   regular  Vitals Entered By: Gladis Riffle, RN (January 29, 2010 8:40 AM) CC: BP 116/71-149/87  last two days, 170[place]80 [place] fire station 4[place]12 PM with anxiety attack Is Patient Diabetic? No     Menstrual Status regular Last PAP Result NEGATIVE FOR INTRAEPITHELIAL LESIONS OR MALIGNANCY.   CC:  BP 116/71-149/87  last two days and 170/80 at fire station 4/12 PM with anxiety attack.  History of Present Illness:  Follow-Up Visit      This is a 50 year old woman who presents for Follow-up visit.  The patient denies chest pain and palpitations.  Since the last visit the patient notes no new problems or concerns.  The patient reports monitoring BP.  When questioned about possible medication side effects, the patient notes none.  In the midst of a panic attack she had mrkedly elevated bp (see phone note---reviewed). she wasnot taking metoprolol two times a day (once daily at the most)  All other systems reviewed and were negative   Preventive Screening-Counseling & Management  Alcohol-Tobacco     Smoking Status: current     Packs/Day: 1.0  Current Problems (verified): 1)  Hematuria, Hx of  (ICD-V13.09) 2)  Physical Examination  (ICD-V70.0) 3)  Anxiety  (ICD-300.00) 4)  Tobacco Use  (ICD-305.1) 5)  Cholelithiasis, Asymptomatic  (ICD-574.20) 6)  Nephrolithiasis, Hx of  (ICD-V13.01) 7)  Hypertension  (ICD-401.9) 8)  Hyperlipidemia  (ICD-272.4)  Current Medications (verified): 1)  Metoprolol Tartrate 25 Mg Tabs (Metoprolol Tartrate) .... Take 1 Tablet By Mouth Once A Day 2)  Adult Aspirin Ec Low Strength 81 Mg  Tbec (Aspirin) .... One By Mouth Daily 3)  Promethazine Hcl 25 Mg Tabs  (Promethazine Hcl) .... Take 1 Tablet By Mouth Once A Day As Needed Nausea 4)  No Narcotics!!! 5)  B Complex  Tabs (B Complex Vitamins) .... Once Daily 6)  Clonazepam 0.5 Mg Tabs (Clonazepam) .... Take One Tablet By Mouth Every Other Day As Needed Anxiety  Allergies: 1)  ! 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   Eyes:  pupils equal and pupils round.   Ears:  R ear normal and L ear normal.   Nose:  no external deformity and no external erythema.   Neck:  No deformities, masses, or tenderness noted. Lungs:  normal respiratory effort and  normal breath sounds.   Heart:  normal rate and regular rhythm.   Abdomen:  Bowel sounds positive,abdomen soft and non-tender without masses, organomegaly or hernias noted. Msk:  no CVA tenderness Neurologic:  cranial nerves II-XII intact and gait normal.     Impression & Recommendations:  Problem # 1:  HYPERTENSION (ICD-401.9)  BPs are ok, not perfect Her updated medication list for this problem includes:    Metoprolol Tartrate 25 Mg Tabs  (Metoprolol tartrate) .Marland Kitchen... Take 1 tablet by mouth two times a day  BP today: 130/86 Prior BP: 140/90 (06/20/2009)  Labs Reviewed: K+: 4.1 (03/14/2009) Creat: : 0.8 (03/14/2009)   Chol: 211 (03/14/2009)   HDL: 30.50 (03/14/2009)   LDL: DEL (02/22/2008)   TG: 180.0 (03/14/2009)  Orders: EKG w/ Interpretation (93000)  Problem # 2:  HYPERLIPIDEMIA (ICD-272.4) reviewed labs Labs Reviewed: SGOT: 22 (03/14/2009)   SGPT: 16 (03/14/2009)   HDL:30.50 (03/14/2009), 25.9 (02/22/2008)  LDL:DEL (02/22/2008), 137 (08/03/2007)  Chol:211 (03/14/2009), 140 (02/22/2008)  Trig:180.0 (03/14/2009), 279 (02/22/2008)  Problem # 3:  TOBACCO USE (ICD-305.1)  Encouraged smoking cessation and discussed different methods for smoking cessation.   Complete Medication List: 1)  Metoprolol Tartrate 25 Mg Tabs (Metoprolol tartrate) .... Take 1 tablet by mouth two times a day 2)  Adult Aspirin Ec Low Strength 81 Mg Tbec (Aspirin) .... One by mouth daily 3)  Promethazine Hcl 25 Mg Tabs (Promethazine hcl) .... Take 1 tablet by mouth once a day as needed nausea 4)  No Narcotics!!!  5)  B Complex Tabs (B complex vitamins) .... Once daily 6)  Clonazepam 0.5 Mg Tabs (Clonazepam) .... Take one tablet by mouth every other day as needed anxiety  Patient Instructions: 1)  Please schedule a follow-up appointment in 1 month.   Echocardiogram Report  Procedure date:  01/29/2010  Findings:      Normal study. Normal left ventricular systolic function. No regional wall motion abnormalities are seen.   EKG Report  Procedure date:  01/29/2010  Findings:      Normal sinus rhythm with rate of

## 2010-11-19 NOTE — Assessment & Plan Note (Signed)
Summary: 1 month fup//ccm   Vital Signs:  Patient profile:   50 year old female Menstrual status:  regular Weight:      134 pounds Temp:     98.8 degrees F oral Pulse rate:   76 / minute Pulse rhythm:   regular Resp:     12 per minute BP sitting:   110 / 76  (left arm) Cuff size:   regular  Vitals Entered By: Gladis Riffle, RN (Feb 26, 2010 8:55 AM) CC: 1 month rov--went to ER and given hydrocodone-acetaminophen for kidney stone pain--states passed stones 5/3 and 5/4 and told no more present Is Patient Diabetic? No Comments BP 110/64- 158/92 at home   CC:  1 month rov--went to ER and given hydrocodone-acetaminophen for kidney stone pain--states passed stones 5/3 and 5/4 and told no more present.  History of Present Illness:  Follow-Up Visit      This is a 50 year old woman who presents for Follow-up visit.  The patient denies chest pain and palpitations.  Since the last visit the patient notes no new problems or concerns (except has had kidney stones).  The patient reports taking meds as prescribed.  When questioned about possible medication side effects, the patient notes none.    All other systems reviewed and were negative   Preventive Screening-Counseling & Management  Alcohol-Tobacco     Smoking Status: current     Smoking Cessation Counseling: yes     Packs/Day: 1.0  Current Problems (verified): 1)  Hematuria, Hx of  (ICD-V13.09) 2)  Physical Examination  (ICD-V70.0) 3)  Anxiety  (ICD-300.00) 4)  Tobacco Use  (ICD-305.1) 5)  Cholelithiasis, Asymptomatic  (ICD-574.20) 6)  Nephrolithiasis, Hx of  (ICD-V13.01) 7)  Hypertension  (ICD-401.9) 8)  Hyperlipidemia  (ICD-272.4)  Current Medications (verified): 1)  Metoprolol Tartrate 25 Mg Tabs (Metoprolol Tartrate) .... Take 1 Tablet By Mouth Two Times A Day 2)  Adult Aspirin Ec Low Strength 81 Mg  Tbec (Aspirin) .... One By Mouth Daily 3)  Promethazine Hcl 25 Mg Tabs (Promethazine Hcl) .... Take 1 Tablet By Mouth Once A  Day As Needed Nausea 4)  No Narcotics!!! 5)  B Complex  Tabs (B Complex Vitamins) .... Once Daily 6)  Clonazepam 0.5 Mg Tabs (Clonazepam) .... Take One Tablet By Mouth Every Other Day As Needed Anxiety 7)  Hydrocodone-Acetaminophen 5-325 Mg Tabs (Hydrocodone-Acetaminophen) .... Take One To Two Every Six Hours As Needed Pain  Allergies: 1)  ! 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   Head:  normocephalic and atraumatic.   Eyes:  pupils equal and pupils round.   Ears:  R ear normal and L ear normal.   Neck:  No deformities, masses, or tenderness noted. Lungs:  normal respiratory effort and normal breath sounds.   Heart:  normal rate and regular rhythm.   Abdomen:  soft and non-tender.  Msk:  no CVA tenderness Neurologic:  cranial nerves II-XII intact and gait normal.     Impression & Recommendations:  Problem # 1:  HYPERTENSION (ICD-401.9) controlled continue current medications  Her updated medication list for this problem includes:    Metoprolol Tartrate 25 Mg Tabs (Metoprolol tartrate) .Marland Kitchen... Take 1 tablet by mouth two times a day  BP today: 110/76 Prior BP: 130/86 (01/29/2010)  Labs Reviewed: K+: 4.1 (03/14/2009) Creat:  : 0.8 (03/14/2009)   Chol: 211 (03/14/2009)   HDL: 30.50 (03/14/2009)   LDL: DEL (02/22/2008)   TG: 180.0 (03/14/2009)  Problem # 2:  NEPHROLITHIASIS (ICD-592.0) recurent followed by urology have reviewed labs and [facility]  Problem # 3:  TOBACCO USE (ICD-305.1) discussed trial budeprion---she has had success previously side effects discussed  Complete Medication List: 1)  Metoprolol Tartrate 25 Mg Tabs (Metoprolol tartrate) .... Take 1 tablet by mouth two times a day 2)  Adult Aspirin Ec Low Strength 81 Mg Tbec (Aspirin) .... One by mouth daily 3)  Promethazine Hcl 25 Mg Tabs (Promethazine hcl) .... Take 1 tablet by mouth once a day as needed nausea 4)  No Narcotics!!!  5)  B Complex Tabs (B complex vitamins) .... Once daily 6)  Clonazepam 0.5 Mg Tabs (Clonazepam) .... Take one tablet by mouth every other day as needed anxiety 7)  Budeprion Sr 150 Mg Tb12 (Bupropion hcl) .... Take 1 tablet by mouth once a day for 7 days and then take 1 tablet by mouth two times a day  Patient Instructions: 1)  Please schedule a follow-up appointment in 6 months. 2)  Tobacco is very bad for your health and your loved ones! You Should stop smoking!. 3)  Stop Smoking Tips: Choose a Quit date. Cut down before the Quit date. decide what you will do as a substitute when you feel the urge to smoke(gum,toothpick,exercise). Prescriptions: BUDEPRION SR 150 MG TB12 (BUPROPION HCL) Take 1 tablet by mouth once a day for 7 days and then Take 1 tablet by mouth two times a day  #60 x 3   Entered and Authorized by:   Birdie Sons MD   Signed by:   Birdie Sons MD on 02/26/2010   Method used:   [facility]        [facility]  [place] [address]       [address]       [place], [place]  [address]       Ph: [phone] or [phone]       Fax: [phone]   RxID:   [id number]

## 2010-11-26 ENCOUNTER — Telehealth: Payer: Self-pay | Admitting: *Deleted

## 2010-11-26 NOTE — Telephone Encounter (Signed)
Pt has GI virus, and wants to know if she can take Phenergan that she has at home. LMTCB about where she got the phenergan and if she has taken it before, etc.

## 2010-11-27 ENCOUNTER — Telehealth: Payer: Self-pay | Admitting: *Deleted

## 2010-11-27 NOTE — Telephone Encounter (Signed)
Pt is asking if Dr. Cato Mulligan feels she can go to the [facility] home tonight.  She feels much better from her GI virus.

## 2010-11-27 NOTE — Telephone Encounter (Signed)
Give pt's Dr. Marliss Coots recommendations.

## 2010-11-27 NOTE — Telephone Encounter (Signed)
Pt aware.

## 2010-11-27 NOTE — Telephone Encounter (Signed)
ok 

## 2010-11-27 NOTE — Telephone Encounter (Signed)
Should be ok

## 2010-12-04 ENCOUNTER — Telehealth: Payer: Self-pay | Admitting: *Deleted

## 2010-12-04 NOTE — Telephone Encounter (Signed)
Refill hydrocodone 1 tsp every 6 hours prn for cough

## 2010-12-04 NOTE — Telephone Encounter (Signed)
rx called in

## 2010-12-04 NOTE — Telephone Encounter (Signed)
Ok x one

## 2010-12-14 ENCOUNTER — Encounter: Payer: Self-pay | Admitting: Family Medicine

## 2010-12-14 ENCOUNTER — Telehealth: Payer: Self-pay | Admitting: *Deleted

## 2010-12-14 ENCOUNTER — Ambulatory Visit (INDEPENDENT_AMBULATORY_CARE_PROVIDER_SITE_OTHER): Payer: Self-pay | Admitting: Family Medicine

## 2010-12-14 VITALS — BP 122/88 | Temp 99.3°F | Ht 63.0 in | Wt 137.0 lb

## 2010-12-14 DIAGNOSIS — R509 Fever, unspecified: Secondary | ICD-10-CM

## 2010-12-14 DIAGNOSIS — M549 Dorsalgia, unspecified: Secondary | ICD-10-CM

## 2010-12-14 LAB — POCT URINALYSIS DIPSTICK
Ketones, UA: NEGATIVE
Leukocytes, UA: NEGATIVE
Nitrite, UA: NEGATIVE
pH, UA: 5

## 2010-12-14 MED ORDER — AZITHROMYCIN 250 MG PO TABS: 250.0000 mg | ORAL_TABLET | Freq: Every day | ORAL | Status: AC

## 2010-12-14 NOTE — Telephone Encounter (Signed)
Temp up to 102.5 and is having shaking chills.

## 2010-12-14 NOTE — Telephone Encounter (Signed)
Phone note given to Harriett Sine to discuss fever and symptoms.

## 2010-12-14 NOTE — Progress Notes (Signed)
  Subjective:    Patient ID: Tammy [name], female    DOB: 21-Jun-1961, 50 y.o.   MRN: [id number]  HPI  Patient seen with chief complaint of fever since Saturday 2 days ago. Temperature up to 101.5 this morning. She has mostly cough, initially some sore throat. Intermittent headaches. Diffuse bodyaches. Minimal nasal congestion.   Right flank pain past few days. No burning with urination. No urine frequency. No vaginal discharge. Irregular menses for the past several weeks and has followup with gynecologist in regarding that. Previous history of endometrial ablation in 2009  Also past history or kidney stone requiring surgery to extract.  Patient continues to smoke. Somewhat poor appetite. No weight changes.   Review of Systems    as above. Objective:   Physical Exam  patient is alert and nontoxic in appearance. Oropharynx is moist and clear  eardrums no acute change  Neck supple no adenopathy Chest clear to auscultation somewhat diminished breath sounds throughout. No rales or wheezes. Heart regular rhythm and rate with no murmur Abdomen soft and nontender Skin no rash       Assessment & Plan:   fever. Suspect probable viral syndrome. No evidence for UTI. Observe for now. Start Zithromax if she develops any progressive fever or ongoing productive cough for the next few days.

## 2010-12-14 NOTE — Telephone Encounter (Signed)
Spoke with pt.  Fever spiked to 102.  No vomiting and no change of symptoms otherwise. She will start Z-max and follow up promptly for any worsening symptoms.

## 2010-12-16 ENCOUNTER — Telehealth: Payer: Self-pay | Admitting: *Deleted

## 2010-12-16 NOTE — Telephone Encounter (Signed)
Pt is feeling better all the time, but wants to know when she will get her xray results from [facility] and if they will come to you.

## 2011-01-03 IMAGING — MG MM DIGITAL SCREENING
5 series · 5 of 5 positions shown · non-contrast
Comparison: none

DG SCREEN MAMMOGRAM BILATERAL
Bilateral CC and MLO view(s) were taken.

DIGITAL SCREENING MAMMOGRAM WITH CAD:
The breast tissue is extremely dense.  No masses or malignant type calcifications are identified.  
Compared with prior studies.
Images were processed with CAD.

[R CC]
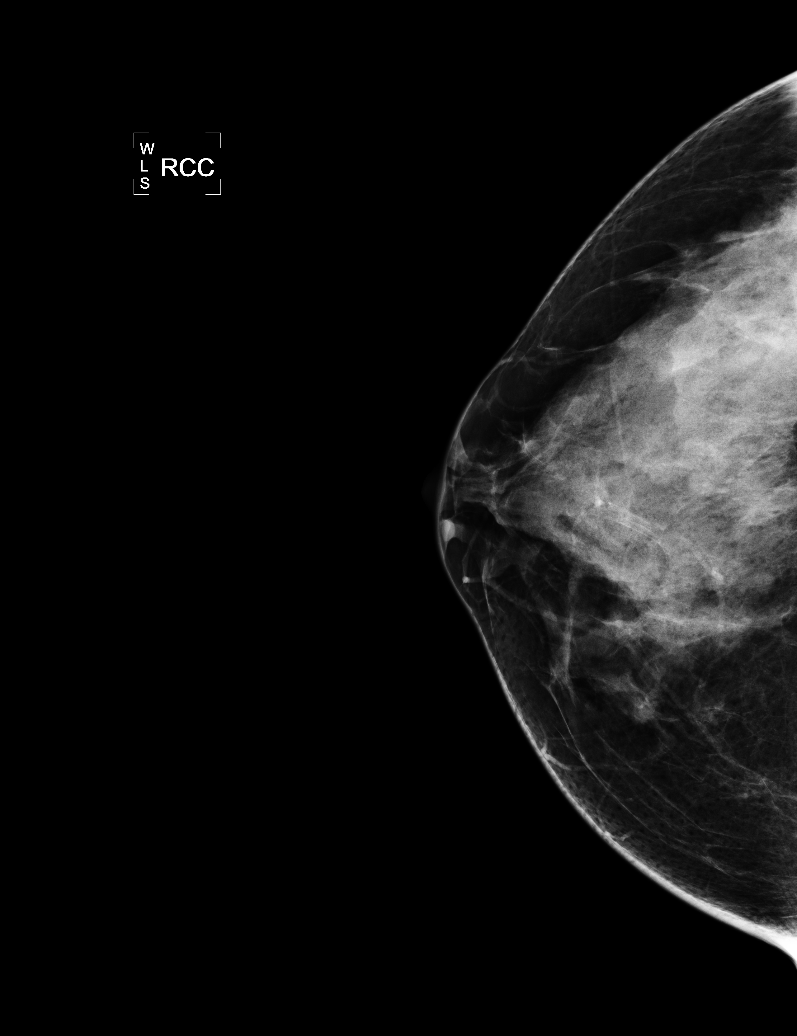

[R MLO]
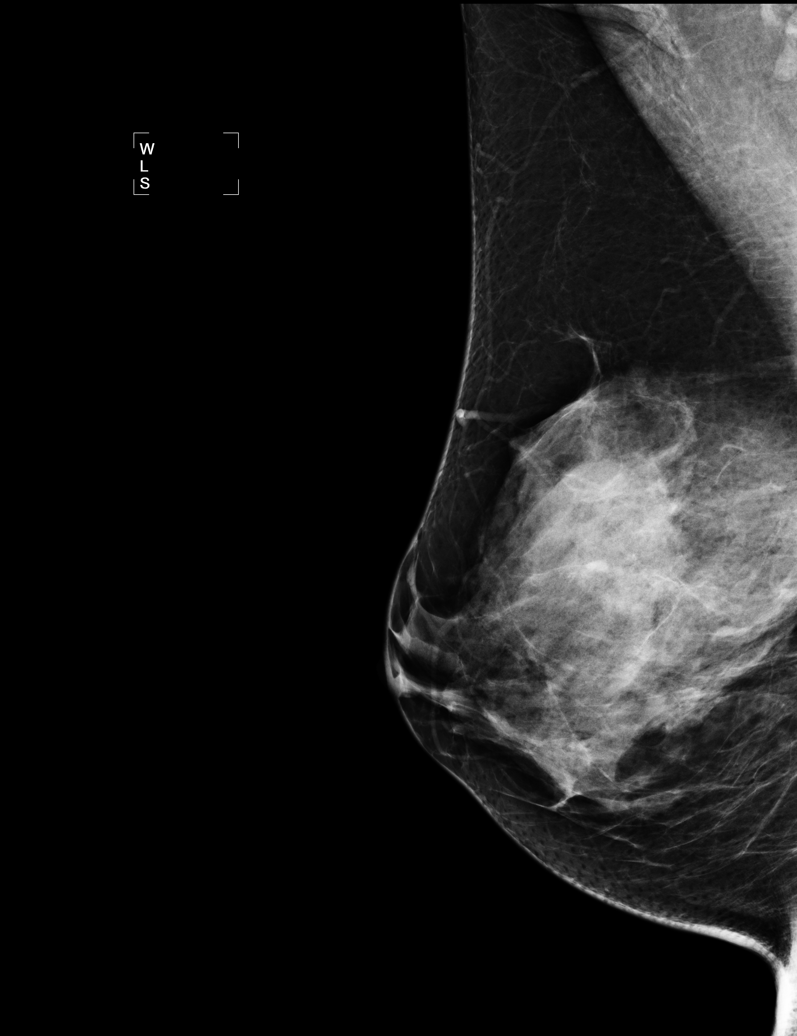

[L CC (1 of 2)]
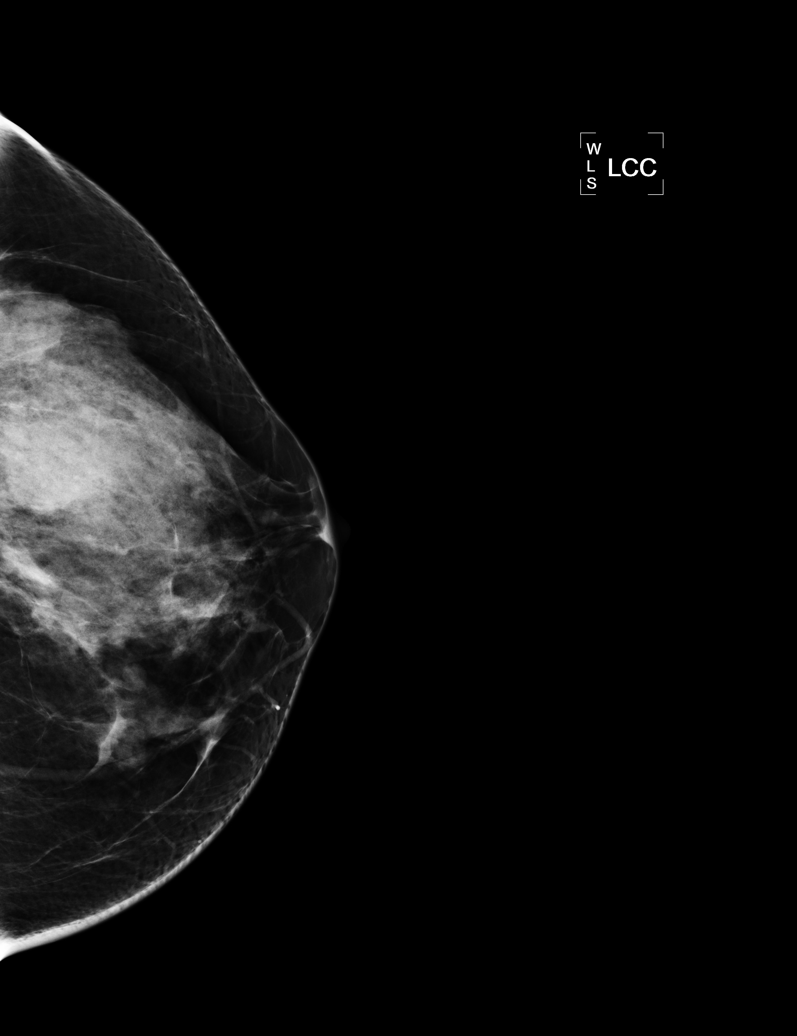

[L MLO]
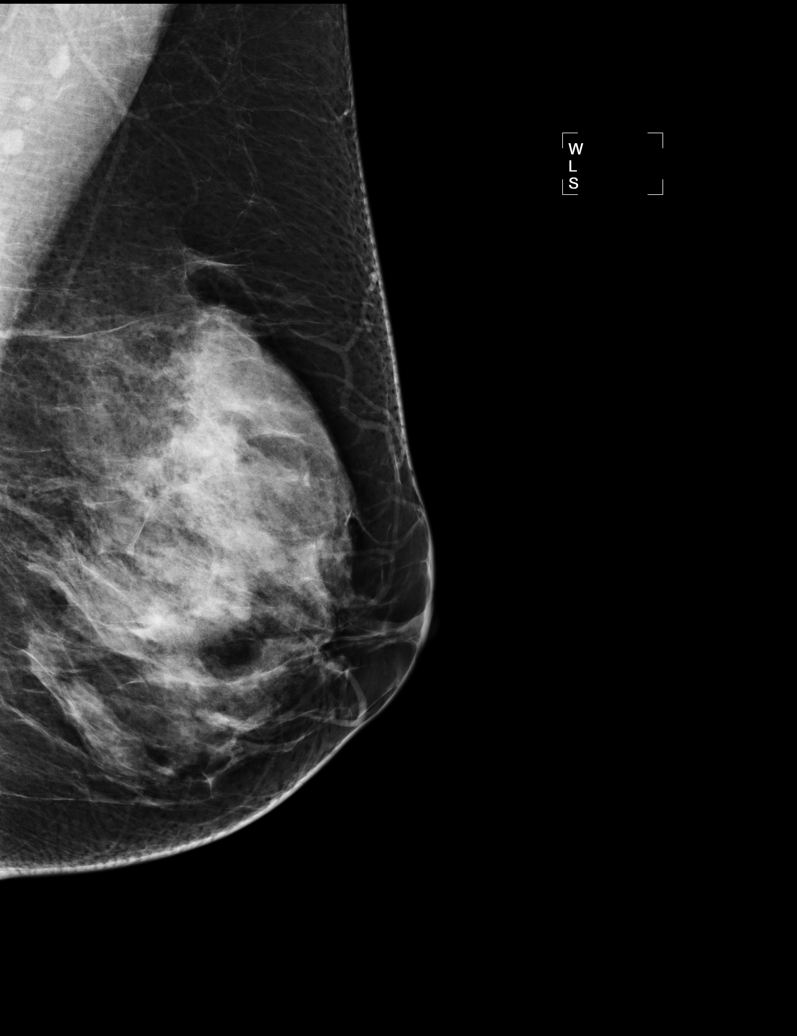

[L CC (2 of 2)]
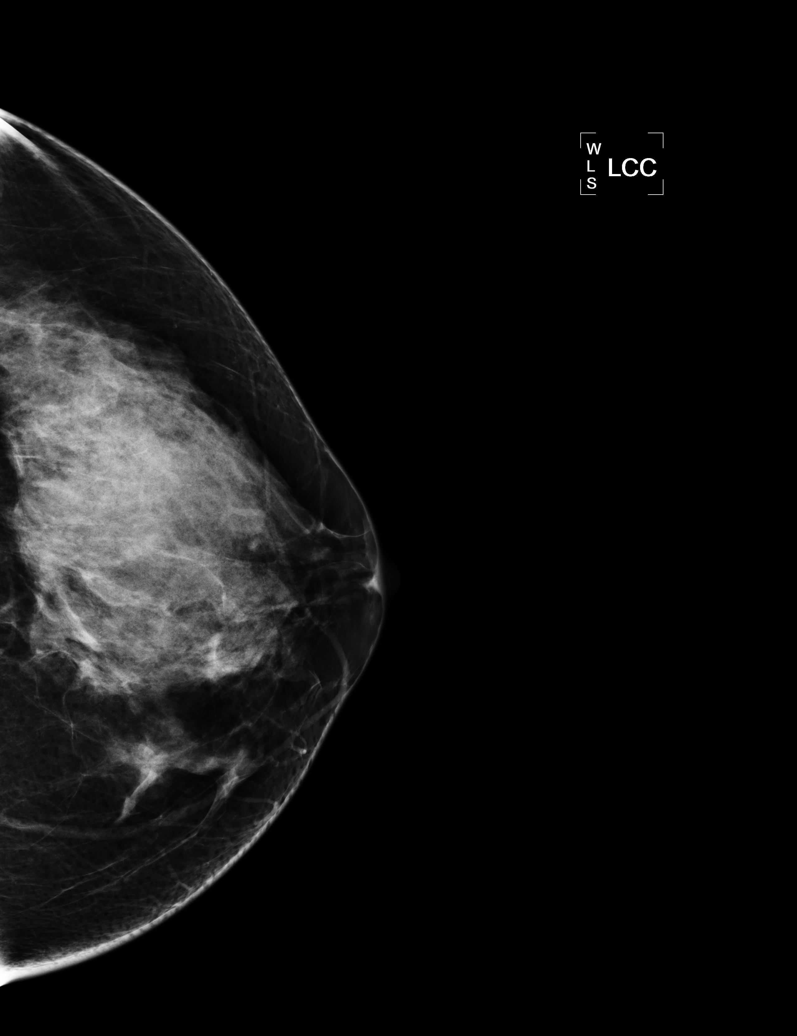

[5 of 5 positions shown; findings below may reference images not displayed]

IMPRESSION: No specific mammographic evidence of malignancy.  Next screening mammogram is recommended in one 
year. Consider screening MRI every 1-2 years given the patient's high risk for breast cancer.

A result letter of this screening mammogram will be mailed directly to the patient.

ASSESSMENT: Negative - BI-RADS 1

Screening mammogram in 1 year.
,

## 2011-01-05 LAB — URINALYSIS, ROUTINE W REFLEX MICROSCOPIC
Bilirubin Urine: NEGATIVE
Glucose, UA: NEGATIVE mg/dL
Ketones, ur: NEGATIVE mg/dL
Leukocytes, UA: NEGATIVE
Nitrite: NEGATIVE
Protein, ur: 30 mg/dL — AB
Specific Gravity, Urine: 1.022 (ref 1.005–1.030)
Urobilinogen, UA: 0.2 mg/dL (ref 0.0–1.0)
pH: 6 (ref 5.0–8.0)

## 2011-01-05 LAB — URINE MICROSCOPIC-ADD ON

## 2011-01-05 LAB — PREGNANCY, URINE: Preg Test, Ur: NEGATIVE

## 2011-01-08 ENCOUNTER — Other Ambulatory Visit: Payer: Self-pay | Admitting: Obstetrics & Gynecology

## 2011-01-08 ENCOUNTER — Telehealth: Payer: Self-pay | Admitting: *Deleted

## 2011-01-08 ENCOUNTER — Encounter: Payer: Self-pay | Admitting: Obstetrics & Gynecology

## 2011-01-08 ENCOUNTER — Ambulatory Visit: Payer: Self-pay | Admitting: Obstetrics & Gynecology

## 2011-01-08 DIAGNOSIS — Z01419 Encounter for gynecological examination (general) (routine) without abnormal findings: Secondary | ICD-10-CM

## 2011-01-08 DIAGNOSIS — R102 Pelvic and perineal pain: Secondary | ICD-10-CM

## 2011-01-08 DIAGNOSIS — N920 Excessive and frequent menstruation with regular cycle: Secondary | ICD-10-CM

## 2011-01-08 LAB — CONVERTED CEMR LAB
Chlamydia, Swab/Urine, PCR: NEGATIVE
HCT: 44.9 % (ref 36.0–46.0)
Hemoglobin: 16 g/dL — ABNORMAL HIGH (ref 12.0–15.0)
MCHC: 35.6 g/dL (ref 30.0–36.0)
MCV: 97 fL (ref 78.0–100.0)
RBC: 4.63 M/uL (ref 3.87–5.11)

## 2011-01-08 NOTE — Telephone Encounter (Signed)
ERROR

## 2011-01-09 NOTE — Progress Notes (Signed)
NAMEMAZEL, VILLELA NO.:  [id number]  MEDICAL RECORD NO.:  [id number]           PATIENT TYPE:  A  LOCATION:  [facility]                   FACILITY:  [facility]  PHYSICIAN:  Allie Bossier, MD        DATE OF BIRTH:  1961-02-23  DATE OF SERVICE:  01/08/2011                                 CLINIC NOTE  [name] is a 50 year old single white G3, P2-0-1-2, who I last saw in 2009, when I did an endometrial ablation (NovaSure) for dysfunctional uterine bleeding.  She went from 2009 until December 2011, with no period and since then she has been having approximately 14 days per month of vaginal bleeding.  Her past medical history is unchanged.  She has a history of a 2-cm fibroid, history of kidney stones on the right.  She is a smoker. History of angina, but no cardiologist, hypertension, and recently she has been diagnosed with tinnitus requiring hearing aids.  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 She is sexually active and has been monogamous for last 5 years.  In the last several weeks, she started to have pain with intercourse.  She lives with her boyfriend.  She is really unsure if she had a Pap smear at all last year, but if she did, it was in the spring at [place].  PREVIOUS SURGERIES:  She had tubal ligation in April 2006, that she had the NovaSure ablation in July of 2009.  FAMILY HISTORY:  Significant for breast cancer in a half-sister, but she denies family history of GYN or colon malignancies.  Another half sister was diagnosed with a cancer and died from it, but [name], is unaware of the primary source of this.  No latex allergies.  No drug allergies.  SOCIAL HISTORY:  She has smoked at least a pack a day since she was 50 years of age.  She drinks alcohol rarely and denies drugs.  MEDICATIONS: 1. Metoprolol b.i.d. 2. Aspirin 81 mg daily. 3. Hydrochlorothiazide 12.5 mg daily. 4. Urocit-K 10 mEq t.i.d. 5.  Ibuprofen on a p.r.n. basis.  PHYSICAL EXAMINATION:  GENERAL:  A well-nourished, well-hydrated pleasant white female. VITAL SIGNS:  Height 5 feet 1-1/2 inches, weight 137 pounds, blood pressure 111/61, pulse 72. HEENT:  Normal. BREASTS:  Normal bilaterally. HEART:  Regular rate and rhythm. LUNGS:  Clear to auscultation bilaterally. ABDOMEN:  Benign. EXTERNAL GENITALIA:  Entirely normal.  There is no abnormal discharge, lesions.  Vagina, all normal.  Cervix.  No lesions.  No abnormal discharge.  Uterus is normal size and shape, anteverted mobile.  Left adnexa nontender.  No masses.  On the right, I do feel approximately a 3- cm ovarian cyst and adnexal exam is somewhat tender.  ASSESSMENT AND PLAN: 1. Annual exam.  I have checked Pap smear.  Recommended self-breast     and self-vulvar exams. 2. New-onset dysfunctional uterine bleeding after 2 years of     amenorrhea following a NovaSure ablation.  I am going to start with     cervical cultures, a CBC, a TSH, GYN ultrasound.  When these  results are available, I will do an endometrial biopsy and we will     go forward with her treatment plan based on the results.     Allie Bossier, MD    MCD/MEDQ  D:  01/08/2011  T:  01/09/2011  Job:  [id number]

## 2011-01-19 ENCOUNTER — Ambulatory Visit (HOSPITAL_COMMUNITY): Payer: Self-pay

## 2011-01-22 LAB — URINE MICROSCOPIC-ADD ON

## 2011-01-22 LAB — URINALYSIS, ROUTINE W REFLEX MICROSCOPIC
Specific Gravity, Urine: 1.019 (ref 1.005–1.030)
Urobilinogen, UA: 1 mg/dL (ref 0.0–1.0)
pH: 5.5 (ref 5.0–8.0)

## 2011-01-26 ENCOUNTER — Ambulatory Visit (HOSPITAL_COMMUNITY)
Admission: RE | Admit: 2011-01-26 | Discharge: 2011-01-26 | Disposition: A | Discharge status: Home / Self Care | Payer: Self-pay | Source: Ambulatory Visit | Attending: Obstetrics & Gynecology | Admitting: Obstetrics & Gynecology

## 2011-01-26 DIAGNOSIS — N949 Unspecified condition associated with female genital organs and menstrual cycle: Secondary | ICD-10-CM | POA: Insufficient documentation

## 2011-01-26 DIAGNOSIS — R102 Pelvic and perineal pain: Secondary | ICD-10-CM

## 2011-01-26 DIAGNOSIS — D259 Leiomyoma of uterus, unspecified: Secondary | ICD-10-CM | POA: Insufficient documentation

## 2011-02-03 ENCOUNTER — Ambulatory Visit (INDEPENDENT_AMBULATORY_CARE_PROVIDER_SITE_OTHER): Payer: Self-pay | Admitting: Internal Medicine

## 2011-02-03 ENCOUNTER — Encounter: Payer: Self-pay | Admitting: Internal Medicine

## 2011-02-03 DIAGNOSIS — F17200 Nicotine dependence, unspecified, uncomplicated: Secondary | ICD-10-CM

## 2011-02-03 DIAGNOSIS — I10 Essential (primary) hypertension: Secondary | ICD-10-CM

## 2011-02-03 DIAGNOSIS — N200 Calculus of kidney: Secondary | ICD-10-CM

## 2011-02-03 DIAGNOSIS — E785 Hyperlipidemia, unspecified: Secondary | ICD-10-CM

## 2011-02-03 LAB — POCT URINALYSIS DIPSTICK
Ketones, UA: NEGATIVE
Leukocytes, UA: NEGATIVE
Nitrite, UA: NEGATIVE
Protein, UA: NEGATIVE
pH, UA: 5.5

## 2011-02-03 LAB — LDL CHOLESTEROL, DIRECT: Direct LDL: 138.2 mg/dL

## 2011-02-03 LAB — BASIC METABOLIC PANEL
CO2: 30 mEq/L (ref 19–32)
Calcium: 9.9 mg/dL (ref 8.4–10.5)
GFR: 74.85 mL/min (ref >60.00)
Glucose, Bld: 76 mg/dL (ref 70–99)
Potassium: 3.8 mEq/L (ref 3.5–5.1)
Sodium: 143 mEq/L (ref 135–145)

## 2011-02-03 LAB — LIPID PANEL
Total CHOL/HDL Ratio: 6
Triglycerides: 355 mg/dL — ABNORMAL HIGH (ref 0.0–149.0)

## 2011-02-03 NOTE — Progress Notes (Signed)
  Subjective:    Patient ID: Tammy [name], female    DOB: [birth year], 50 y.o.   MRN: [id number]  HPI  htn---tolerating meds  Cloudy urine for 2 weeks  Chronic back pain---better recently   Past Medical History  Diagnosis Date  . HYPERLIPIDEMIA 02/22/2008  . HYPERTENSION 02/22/2008   Past Surgical History  Procedure Date  . Tubal ligation 2006  . Induced abortion age 19  . Kidney stone surgery 2011     [facility], right percutaneous    reports that she has been smoking Cigarettes.  She has a 30 pack-year smoking history. She does not have any smokeless tobacco history on file. Her alcohol and drug histories not on file. family history includes Cancer (age of onset:85) in her father and Cirrhosis (age of onset:62) in her mother. Allergies  Allergen Reactions  . Varenicline Tartrate     REACTION: depression     Review of Systems    patient denies chest pain, shortness of breath, orthopnea. Denies lower extremity edema, abdominal pain, change in appetite, change in bowel movements. Patient denies rashes, musculoskeletal complaints. No other specific complaints in a complete review of systems.    Objective:   Physical Exam Well-developed well-nourished female in no acute distress. HEENT exam atraumatic, normocephalic, neck supple without jugular venous distention. Chest clear to auscultation without increased work of breathing. Cardiac exam S1-S2 are regular. Abdominal exam active bowel sounds, soft, nontender. Extremities there is no  edema.       Assessment & Plan:

## 2011-02-05 LAB — URINE CULTURE

## 2011-02-08 NOTE — Assessment & Plan Note (Signed)
Adequate control. Continue current medications. 

## 2011-02-08 NOTE — Assessment & Plan Note (Signed)
Intolerant to medications. She refuses to try an additional medication. She understands the risks.

## 2011-02-08 NOTE — Assessment & Plan Note (Signed)
Advised absolute abstinence from all tobacco products. She voices understanding but is not interested in quitting.

## 2011-02-15 ENCOUNTER — Ambulatory Visit: Payer: Self-pay | Admitting: Obstetrics & Gynecology

## 2011-02-15 ENCOUNTER — Other Ambulatory Visit: Payer: Self-pay | Admitting: Obstetrics & Gynecology

## 2011-02-15 DIAGNOSIS — N938 Other specified abnormal uterine and vaginal bleeding: Secondary | ICD-10-CM

## 2011-02-15 DIAGNOSIS — N949 Unspecified condition associated with female genital organs and menstrual cycle: Secondary | ICD-10-CM

## 2011-02-16 NOTE — Group Therapy Note (Signed)
[name], [name] NO.:  [id number]  MEDICAL RECORD NO.:  [id number]           PATIENT TYPE:  A  LOCATION:  [facility]                   FACILITY:  [facility]  PHYSICIAN:  Allie Bossier, MD        DATE OF BIRTH:  1961-01-20  DATE OF SERVICE:  02/15/2011                                 CLINIC NOTE  [name] is a 50 year old lady who is status post endometrial ablation in 2009.  She saw me on January 08, 2011, complaining of 14 days of bleeding last month.  She had been amenorrheic from 2009 until 2011.  I did a pelvic exam at her visit including a Pap smear.  The Pap smear was normal.  Ordered a TSH which was normal.  Her hemoglobin was 16.  Her ultrasound came back normal showing a decrease in size of her 1-cm fibroid as well as a 2-mm uterine lining.  She tells me that she has had no further bleeding since the exam until January 08, 2011.  I have reassured her that her testing has not only been normal, but improved since her endometrial ablation.  I have offered her Depo-Provera in the future should her bleeding restart, but at this point, she needs no further treatment.  She will come back in a year or sooner as necessary. Please note she needs a mammogram in October 2012.     Allie Bossier, MD    MCD/MEDQ  D:  02/15/2011  T:  02/16/2011  Job:  [id number]

## 2011-03-02 NOTE — Op Note (Signed)
Tammy [name], Tammy [name]               ACCOUNT NO.:  [id number]   MEDICAL RECORD NO.:  [id number]          PATIENT TYPE:  AMB   LOCATION:  [facility]                           [facility]   PHYSICIAN:  Allie Bossier, MD        DATE OF BIRTH:  1961/09/12   DATE OF PROCEDURE:  05/09/2008  DATE OF DISCHARGE:                               OPERATIVE REPORT   PREOPERATIVE DIAGNOSIS:  Dysfunctional uterine bleeding.   POSTOPERATIVE DIAGNOSIS:  Dysfunctional uterine bleeding.   PROCEDURE:  Endometrial ablation with the NovaSure device.   SURGEON:  Myra C. Marice Potter, MD   ANESTHESIA:  LMA, Tyrone Apple. Malen Gauze, MD   COMPLICATIONS:  None.   ESTIMATED BLOOD LOSS:  Minimal.   SPECIMENS:  None.   FINDINGS:  Normal size and shape anteverted uterus and nonenlarged  adnexa.   DETAILED PROCEDURE AND FINDINGS:  The risks, benefits, and alternatives  of surgery were explained, understood, and accepted.  A 90% satisfaction  rate with this procedure was noted as well as a 50% amenorrhea rate.  She understands and accepts this.  Consents were signed.  She was taken  to the operating room and placed in a dorsal lithotomy position.  General anesthesia was applied without complication.  Her vagina was  prepped and draped in usual sterile fashion.  Her bladder was emptied  with a Robinson catheter.  Bimanual exam revealed normal size and shape  anteverted uterus and nonenlarged adnexa.  A speculum was placed.  The  anterior lip of the cervix was grasped with single-tooth tenaculum.  The  cervix was easily and gently dilated to accommodate a #8 dilator.  Prior  to the dilation, the uterus was sounded, and the sound hit the fundus at  the vertex of the fundus with a 10-cm mark.  Using the NovaSure supplied  device, the cavity length was noted to be 6.5 cm.  The NovaSure device  was placed.  It was seated and the device was enabled.  The heating  cycle started and lasted 1 minute and 15 seconds.  A 10-second  cool-down  period was allowed and the device was removed.  No bleeding was noted  from the tenaculum site as it was removed.  She was taken to the  recovery room in stable condition with the instrument, sponge, and  needle counts correct.  She tolerated the procedure well.      Allie Bossier, MD  Electronically Signed    MCD/MEDQ  D:  05/09/2008  T:  05/10/2008  Job:  [id number]

## 2011-03-02 NOTE — Group Therapy Note (Signed)
NAMEARLO, BUFFONE NO.:  [id number]   MEDICAL RECORD NO.:  [id number]          PATIENT TYPE:  WOC   LOCATION[facility]                   [facility]ILITY[facility]  [facility]   PHYSICIAN:  Argentina Donovan, MD        DATE OF BIRTH:  [birth year]   DATE OF SERVICE:  03/21/2008                                  CLINIC NOTE   The patient is a 50 year old white female gravida 3, para 2-0-1-2 who  for many years was on birth control pills, and several years ago,  she  had tubal ligation and since that time her periods have gotten much  heavier.  Since the beginning of this year; however, she has had very  few days she said when she did not bleed or spot.  She had been  concerned about that.  She has also had a history of multiple episodes  of renal lithiasis.  She had a CT scan in September 2008, to look for  kidney stones and at that time they saw a small cyst of 11-mm on the  left ovary.  [facility]  They  did an ultrasound at that time because she was complaining of the  bleeding, which showed a small fundal fibroid of 3.3 cm, but the uterus  was very small measuring 8.2 x 4.2 x 4.9.  The left ovary; however, at  this time was 3.5 x 3.3 x 3.1 cm with a complex cyst.  She came in today  because of the bleeding mainly, but also to follow up on the ovarian  cyst.  I have had a long discussion with she and her husband.  She is a  smoker, although she has quit date in mind.  She decided that she did  not want her periods to be this heavy and being a smoker, I told her we  could not give her hormone therapy to help control them.  I have;  however, mentioned and offered her and explained to her the pros and  cons of endometrial ablation.  I told her about the hydrothermal as well  as a NovaSure, and we have given her written information on that.  However, I told her in order to perform that the several criteria we had  to meet first was, what was  happening with the lining of the uterus,  therefore, today endometrial biopsy was done.  At that point, the uterus  was sounded to a depth of 7-cm and done with ease, there was a small  return, which was sent for pathological diagnosis.  In addition, we are  going to repeat the ultrasound to see if there is any change in the  ovarian cyst as the procedure may have to be done in view of that  finding and in addition to this, we are getting a CBC and a CA-125.  The  patient will return in 2 weeks for results.  If everything is okay at  that time and she does not need anything done with the ovarian cyst,  then we will schedule her for endometrial  ablation.   DIAGNOSES:  Persistent left ovarian complex cyst and menometrorrhagia.           ______________________________  Argentina Donovan, MD     PR/MEDQ  D:  03/21/2008  T:  03/22/2008  Job:  [id number]

## 2011-03-02 NOTE — H&P (Signed)
NAMEGLORIANNA, GOTT               ACCOUNT NO.:  [id number]   MEDICAL RECORD NO.:  [id number]          PATIENT TYPE:  AMB   LOCATION:                                [facility]   PHYSICIAN:  Allie Bossier, MD        DATE OF BIRTH:  Jun 15, 1961   DATE OF ADMISSION:  04/04/2008  DATE OF DISCHARGE:                              HISTORY & PHYSICAL   Tammy [name] is a 50 year old G3 para 2-8-1 who had a tubal ligation done in  2006.  Since then she says that her periods have gotten much heavier.  She says her period becomes almost daily.  One day when she had a CT  done for kidney stones a 3.3-cm fibroid of the fundus of the uterus was  seen.  An endometrial biopsy was done on March 21, 2008 that showed  degenerating secretory-type endometrium.  Cervical cultures were  negative.  Her hemoglobin is normal and a CA-125 was normal as well.  This was done because of a 3.5-cm ovarian cyst.   PAST MEDICAL HISTORY:  1. A 2.7-cm uterine fibroid.  2. Recurrent right kidney stones.  3. She is a smoker.  4. Hypertension (she is cared for at [facility]).   REVIEW OF SYSTEMS:  She owns a day care.  She is sexually active.  Her  Pap smear was normal in July of 2008 at [facility] as was a mammogram.   PAST SURGICAL HISTORY:  Tubal ligation in April 2006.   FAMILY HISTORY:  Positive for breast cancer in her half sister.  She  denies a family history of GYN and colon cancers.  No latex allergies.   ALLERGIES:  No known drug allergies.   SOCIAL HISTORY:  She smoked more than a pack-a-day for the last 18  years.  She denies alcohol use.   ASSESSMENT/PLAN:  Dysfunctional uterine bleeding.  Dr. Okey Dupre has  discussed with this patient a endometrial ablation.  She declines  hormone replacement therapy and wishes to proceed as planned.  Please  note that the cyst that was seen on the CT in March was resolved on a  followup ultrasound of March 28, 2008.  Risks of surgery explained and  she accepted.  I have  counseled her that approximately 90% of women are  pleased with the bleeding pattern that they have after endometrial  ablation, but that only 50% or less are amenorrheic.  She understands  and wishes to proceed.      Allie Bossier, MD  Electronically Signed     MCD/MEDQ  D:  05/09/2008  T:  05/09/2008  Job:  [id number]

## 2011-03-05 NOTE — Op Note (Signed)
Tammy [name], Tammy [name]               ACCOUNT NO.:  [id number]   MEDICAL RECORD NO.:  [id number]          PATIENT TYPE:  AMB   LOCATION:  [facility]                           [facility]   PHYSICIAN:  Roseanna Rainbow, M.D.DATE OF BIRTH:  1960/12/30   DATE OF PROCEDURE:  02/05/2005  DATE OF DISCHARGE:                                 OPERATIVE REPORT   PREOPERATIVE DIAGNOSIS:  Multiparity, desires sterilization procedure.   POSTOPERATIVE DIAGNOSIS:  Multiparity, desires sterilization procedure.   PROCEDURE:  Laparoscopic bilateral tubal ligation with bipolar cautery.   SURGEON:  Roseanna Rainbow, M.D.   ANESTHESIA:  General endotracheal.   IV FLUIDS:  As per anesthesiology.   URINE OUTPUT:  100 mL clear urine at the beginning of the procedure.   COMPLICATIONS:  None.   BLOOD LOSS:  Minimal   DESCRIPTION OF PROCEDURE:  The patient was taken to the operating room with  an IV running. She was placed in the dorsal lithotomy position and prepped  and draped in the usual sterile fashion. A bivalve speculum was then placed  in the patient's vagina. The anterior lip of the cervix was grasped with a  single-tooth tenaculum. A Hulka manipulator was then introduced into the  uterus and secured to the anterior lip of the cervix as a means to  manipulate the uterus. The single-tooth tenaculum and speculum were then  removed. Attention was then turned to the patient's abdomen where an  infraumbilical skin incision was made. The Veress needle was then introduced  into the abdominal cavity while tenting up the anterior abdominal wall at a  45 degree angle. Intra-abdominal placement was confirmed by saline drop test  and a low pressure reading upon insufflation with CO2 gas. The abdomen was  insufflated with CO2 gas with 3 liters. The Veress needle was then removed.  The trocar and sleeve were then advanced into the abdominal cavity where  intra-abdominal placement was confirmed with the  laparoscope. A survey of  the pelvic anatomy demonstrated a possibly arcuate shaped uterus versus a  small cornual myoma on the right. The mid isthmic portion of the right  fallopian tube was then cauterized. A 2-3 cm segment of the tube was  cauterized continuously. With each application the ohmmeter was noted to go  to 0. The left fallopian tube was then manipulated in a similar fashion. All  instruments were removed from the abdomen. The skin was reapproximated  with 3-0 Vicryl and Dermabond. The Hulka manipulator was removed from the  vagina with minimal bleeding noted from the cervix. At the close of the  procedure the instrument and pack counts were said to be correct x2. The  patient was taken to the PACU awake and in stable condition.      LAJ/MEDQ  D:  02/05/2005  T:  02/05/2005  Job:  [id number]

## 2011-04-19 ENCOUNTER — Encounter: Payer: Self-pay | Admitting: Family Medicine

## 2011-04-19 ENCOUNTER — Ambulatory Visit (INDEPENDENT_AMBULATORY_CARE_PROVIDER_SITE_OTHER): Payer: Self-pay | Admitting: Family Medicine

## 2011-04-19 VITALS — BP 110/78 | Temp 99.3°F | Wt 138.0 lb

## 2011-04-19 DIAGNOSIS — J029 Acute pharyngitis, unspecified: Secondary | ICD-10-CM

## 2011-04-19 NOTE — Patient Instructions (Signed)
Viral Pharyngitis  Viral pharyngitis is a sore throat caused by a cold virus. This is not a strep throat. This does not require treatment by an antibiotic (medications that kill germs). It will get better on its own. Antibiotics generally will not help unless this condition is followed by a bacterial (germ) infection.  HOME CARE INSTRUCTIONS   Drink or give your child plenty of fluids. Soft, cold foods such as ice cream, popsicles, or jello may be used.    Gargle with warm salt water (one teaspoon salt per quart of water).    If over age 7, throat lozenges may be used safely.    Only take over-the-counter or prescription medicines for pain, discomfort, or fever as directed by your caregiver. DO NOT USE ASPIRIN.   SEEK MEDICAL CARE IF:   You are better in a few days, then become worse.    You have a fever or pain unresponsive to pain medicines.    There are any other changes that concern you.   Diagnostic tests may have been performed. If you have not received results at time of discharge, receive instructions as how to obtain these results.   Document Released: 07/14/2005 Document Re-Released: 03/22/2008  [employer] Patient Information 2011 [employer].

## 2011-04-19 NOTE — Progress Notes (Signed)
  Subjective:    Patient ID: Tammy [name], female    DOB: [birth year], 50 y.o.   MRN: [id number]  HPI Patient is seen with onset about 4 days ago of low grade fever. Some mild sore throat. She had skin rash and was concerned she may have scarlet fever. Reported exposure to scarlet fever about 10 days ago. 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 She has had some nasal congestion and some dry cough. Using hydrocodone cough syrup for cough. Upper back rash is pruritic.   Review of Systems  Constitutional: Positive for fever and chills. Negative for appetite change.  HENT: Positive for congestion and sore throat.   Respiratory: Positive for cough. Negative for shortness of breath and wheezing.   Cardiovascular: Negative for chest pain.  Skin: Positive for rash.       Objective:   Physical Exam  Constitutional: She is oriented to person, place, and time. She appears well-developed and well-nourished. No distress.  HENT:  Right Ear: External ear normal.  Left Ear: External ear normal.       Mild posterior pharyngeal erythema. No exudate  Neck: Neck supple.  Cardiovascular: Normal rate, regular rhythm and normal heart sounds.   Pulmonary/Chest: Effort normal and breath sounds normal. No respiratory distress. She has no wheezes. She has no rales.  Musculoskeletal: She exhibits no edema.  Lymphadenopathy:    She has no cervical adenopathy.  Neurological: She is alert and oriented to person, place, and time.  Skin:       She is nonspecific follicular rash upper back with some excoriations. No pustules. Non-scaly          Assessment & Plan:  Viral pharyngitis. Rapid strep negative. Reassurance given and symptomatic treatment. Nonspecific rash. No evidence for scarlet fever

## 2011-06-30 ENCOUNTER — Other Ambulatory Visit: Payer: Self-pay | Admitting: Internal Medicine

## 2011-06-30 DIAGNOSIS — Z1231 Encounter for screening mammogram for malignant neoplasm of breast: Secondary | ICD-10-CM

## 2011-07-13 ENCOUNTER — Other Ambulatory Visit: Payer: Self-pay | Admitting: *Deleted

## 2011-07-13 MED ORDER — HYDROCODONE-HOMATROPINE 5-1.5 MG/5ML PO SYRP: 2.5000 mL | ORAL_SOLUTION | Freq: Four times a day (QID) | ORAL | Status: DC | PRN

## 2011-07-16 LAB — CBC
MCHC: 34.3
MCV: 100.8 — ABNORMAL HIGH
Platelets: 244
RDW: 12.2

## 2011-08-05 LAB — URINALYSIS, ROUTINE W REFLEX MICROSCOPIC
Leukocytes, UA: NEGATIVE
Nitrite: NEGATIVE
Protein, ur: 30 — AB
Urobilinogen, UA: 1

## 2011-08-05 LAB — PREGNANCY, URINE: Preg Test, Ur: NEGATIVE

## 2011-08-05 LAB — POCT URINE HEMOGLOBIN
Hgb urine dipstick: POSITIVE — AB
Operator id: 29011

## 2011-08-05 LAB — URINE MICROSCOPIC-ADD ON

## 2011-08-16 ENCOUNTER — Ambulatory Visit (INDEPENDENT_AMBULATORY_CARE_PROVIDER_SITE_OTHER): Payer: Self-pay

## 2011-08-16 ENCOUNTER — Ambulatory Visit (HOSPITAL_COMMUNITY)
Admission: RE | Admit: 2011-08-16 | Discharge: 2011-08-16 | Disposition: A | Discharge status: Home / Self Care | Payer: Self-pay | Source: Ambulatory Visit | Attending: Internal Medicine | Admitting: Internal Medicine

## 2011-08-16 DIAGNOSIS — Z1231 Encounter for screening mammogram for malignant neoplasm of breast: Secondary | ICD-10-CM | POA: Insufficient documentation

## 2011-08-16 DIAGNOSIS — Z23 Encounter for immunization: Secondary | ICD-10-CM

## 2011-08-17 ENCOUNTER — Telehealth: Payer: Self-pay | Admitting: Internal Medicine

## 2011-08-17 NOTE — Telephone Encounter (Signed)
I spoke with pt, informed we have not seen the report yet.  Pt asked I call and request notes as she has tried several times with no success.  Medical records person "out sick".  I did leave a detailed message requesting medical records and reports from 10/22 for OV Nov 1. [phone] phone number I called

## 2011-08-17 NOTE — Telephone Encounter (Signed)
Have you seen this report? 

## 2011-08-17 NOTE — Telephone Encounter (Signed)
Seeing Dr Caryl Never this Thursday. [facility] was supposed to have fax a copy of her renal xray/ultrasound, that she did on 08-09-2011. Would like to here back about that. She is still in pain. Thanks.

## 2011-08-17 NOTE — Telephone Encounter (Signed)
I have not seen any reports.

## 2011-08-19 ENCOUNTER — Ambulatory Visit: Payer: Self-pay | Admitting: Family Medicine

## 2011-08-20 ENCOUNTER — Ambulatory Visit (INDEPENDENT_AMBULATORY_CARE_PROVIDER_SITE_OTHER): Payer: Self-pay | Admitting: Family Medicine

## 2011-08-20 ENCOUNTER — Other Ambulatory Visit: Payer: Self-pay | Admitting: Internal Medicine

## 2011-08-20 ENCOUNTER — Encounter: Payer: Self-pay | Admitting: Family Medicine

## 2011-08-20 VITALS — BP 140/90 | Temp 98.7°F | Wt 146.0 lb

## 2011-08-20 DIAGNOSIS — R928 Other abnormal and inconclusive findings on diagnostic imaging of breast: Secondary | ICD-10-CM

## 2011-08-20 DIAGNOSIS — N200 Calculus of kidney: Secondary | ICD-10-CM

## 2011-08-20 MED ORDER — HYDROCODONE-ACETAMINOPHEN 5-500 MG PO TABS: 1.0000 | ORAL_TABLET | ORAL | Status: DC | PRN

## 2011-08-20 NOTE — Progress Notes (Signed)
  Subjective:    Patient ID: Tammy [name], female    DOB: 12/10/1960, 50 y.o.   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 Has been followed by [facility] recently. Had surgery this past summer for large 2 cm right-sided kidney stone. Intermittent pain since then. Reportedly had followup ultrasound in October. We do not have those records. She is taking hydrocodone for intermittent pain. He is taking hydrochlorothiazide and potassium citrate supplement. Has history of many stones. Trying to consume around 3 L of water per day. No pain at this time.   Review of Systems  Constitutional: Negative for fever and chills.  Gastrointestinal: Negative for abdominal pain.  Genitourinary: Negative for dysuria, hematuria and flank pain.       Objective:   Physical Exam  Constitutional: She appears well-developed and well-nourished.  Cardiovascular: Normal rate, regular rhythm and normal heart sounds.   Pulmonary/Chest: Effort normal and breath sounds normal. No respiratory distress. She has no wheezes. She has no rales.  Abdominal: Soft. There is no tenderness.          Assessment & Plan:  Recurrent calcium oxalate stones. Increase dietary calcium, increased dietary phytates, and increased fluid intake. Agree to limited Vicodin 5/500 #20 with no refill 1 every 4 hours as needed. Get records from [facility].  Patient understands controlled meds from one practice only and if she is requiring regular pain meds this would indicate need for further urologic evaluation.

## 2011-08-30 ENCOUNTER — Other Ambulatory Visit: Payer: Self-pay | Admitting: Internal Medicine

## 2011-08-30 ENCOUNTER — Ambulatory Visit: Admission: RE | Admit: 2011-08-30 | Payer: Self-pay | Source: Ambulatory Visit

## 2011-08-30 ENCOUNTER — Ambulatory Visit
Admission: RE | Admit: 2011-08-30 | Discharge: 2011-08-30 | Disposition: A | Discharge status: Home / Self Care | Payer: No Typology Code available for payment source | Source: Ambulatory Visit | Attending: Internal Medicine | Admitting: Internal Medicine

## 2011-08-30 DIAGNOSIS — R928 Other abnormal and inconclusive findings on diagnostic imaging of breast: Secondary | ICD-10-CM

## 2011-09-24 ENCOUNTER — Other Ambulatory Visit: Payer: Self-pay | Admitting: *Deleted

## 2011-09-24 MED ORDER — METOPROLOL TARTRATE 50 MG PO TABS: 50.0000 mg | ORAL_TABLET | Freq: Two times a day (BID) | ORAL | Status: DC

## 2011-11-17 ENCOUNTER — Other Ambulatory Visit: Payer: Self-pay | Admitting: *Deleted

## 2011-11-17 MED ORDER — HYDROCODONE-ACETAMINOPHEN 5-500 MG PO TABS: 1.0000 | ORAL_TABLET | ORAL | Status: AC | PRN

## 2011-11-26 ENCOUNTER — Ambulatory Visit (INDEPENDENT_AMBULATORY_CARE_PROVIDER_SITE_OTHER): Payer: Self-pay | Admitting: Internal Medicine

## 2011-11-26 ENCOUNTER — Encounter: Payer: Self-pay | Admitting: Internal Medicine

## 2011-11-26 VITALS — BP 128/44 | Temp 98.3°F | Wt 150.0 lb

## 2011-11-26 DIAGNOSIS — M79609 Pain in unspecified limb: Secondary | ICD-10-CM

## 2011-11-26 DIAGNOSIS — M79673 Pain in unspecified foot: Secondary | ICD-10-CM

## 2011-11-26 MED ORDER — DICLOFENAC POTASSIUM 50 MG PO TABS: 50.0000 mg | ORAL_TABLET | Freq: Two times a day (BID) | ORAL | Status: DC | PRN

## 2011-11-26 NOTE — Progress Notes (Signed)
Patient ID: Tammy [name], female   DOB: 1961/08/14, 51 y.o.   MRN: [id number] 2 weeks ago woke up and had severe pain of right foot--plantar aspect. Pain has persisted. Interestingly the pain is worse later in the day. She also notes areas of pain right leg--lateral calf, hip and buttock. No weakness, no rash  Past Medical History  Diagnosis Date  . HYPERLIPIDEMIA 02/22/2008  . HYPERTENSION 02/22/2008    History   Social History  . Marital Status: Divorced    Spouse Name: N/A    Number of Children: N/A  . Years of Education: N/A   Occupational History  . Not on file.   Social History Main Topics  . Smoking status: Current Everyday Smoker -- 1.0 packs/day for 30 years    Types: Cigarettes  . Smokeless tobacco: Not on file  . Alcohol Use: Not on file  . Drug Use: Not on file  . Sexually Active: Not on file   Other Topics Concern  . Not on file   Social History Narrative  . No narrative on file    Past Surgical History  Procedure Date  . Tubal ligation 2006  . Induced abortion age 61  . Kidney stone surgery 2011     [facility]rology, right percutaneous    Family History  Problem Relation Age of Onset  . Cirrhosis Mother 28    non alcoholic  . Cancer Father 46    bladder    Allergies  Allergen Reactions  . Varenicline Tartrate     REACTION: depression (Chantix)    Current Outpatient Prescriptions on File Prior to Visit  Medication Sig Dispense Refill  . aspirin 81 MG tablet Take 81 mg by mouth daily.        . fluticasone (FLONASE) 50 MCG/ACT nasal spray Place 2 sprays into the nose daily. As needed      . hydrochlorothiazide (,MICROZIDE/HYDRODIURIL,) 12.5 MG capsule Take 12.5 mg by mouth daily.        Marland Kitchen HYDROcodone-acetaminophen (VICODIN) 5-500 MG per tablet Take 1 tablet by mouth every 4 (four) hours as needed for pain.  20 tablet  0  . metoprolol (LOPRESSOR) 50 MG tablet Take 1 tablet (50 mg total) by mouth 2 (two) times daily.  180 tablet  0  . potassium  chloride (KLOR-CON) 10 MEQ CR tablet Take 10 mEq by mouth 3 (three) times daily.          patient denies chest pain, shortness of breath, orthopnea. Denies lower extremity edema, abdominal pain, change in appetite, change in bowel movements. Patient denies rashes, musculoskeletal complaints. No other specific complaints in a complete review of systems.   BP 128/44  Temp(Src) 98.3 F (36.8 C) (Oral)  Wt 150 lb (68.04 kg)  Well-developed well-nourished female in no acute distress. HEENT exam atraumatic, normocephalic, extraocular muscles are intact. Neck is supple. No jugular venous distention no thyromegaly. Chest clear to auscultation without increased work of breathing. FROM both ankles, no rash, dtrs normal in lower extremity, gait normal. Tenderness to palpation, plantar aspect of right foot.   FOOT PAIN: ? Cause i suspect this is a local inflammatory problem---trial nsaid, ice and stretching Call in 2 weeks if not dramatically better

## 2011-12-17 ENCOUNTER — Telehealth: Payer: Self-pay | Admitting: *Deleted

## 2011-12-17 NOTE — Telephone Encounter (Signed)
Pt has chronic kidney stones and is not happy at [facility].  She does not have any insurance and she has an u/s scheduled on 12/22/11.  She would like Dr Cato Mulligan to start handling her kidney stones.  Told pt given her hx Dr Cato Mulligan may recommend its best she stay with a urologist.  She states that every time she goes there she sees a different resident and they are all telling her different things.  Please advise

## 2011-12-17 NOTE — Telephone Encounter (Signed)
Pt is calling asking for Arline Asp to call her back re: her arrangements at [facility].

## 2011-12-20 NOTE — Telephone Encounter (Signed)
Probably should stay with [facility]

## 2011-12-20 NOTE — Telephone Encounter (Signed)
Pt aware.

## 2012-02-04 ENCOUNTER — Encounter: Payer: No Typology Code available for payment source | Admitting: Internal Medicine

## 2012-02-21 ENCOUNTER — Encounter: Payer: Self-pay | Admitting: Internal Medicine

## 2012-02-24 ENCOUNTER — Encounter: Payer: Self-pay | Admitting: Family Medicine

## 2012-02-24 ENCOUNTER — Ambulatory Visit (INDEPENDENT_AMBULATORY_CARE_PROVIDER_SITE_OTHER): Payer: Self-pay | Admitting: Family Medicine

## 2012-02-24 VITALS — BP 130/80 | Temp 97.7°F | Wt 151.0 lb

## 2012-02-24 DIAGNOSIS — R059 Cough, unspecified: Secondary | ICD-10-CM

## 2012-02-24 DIAGNOSIS — R05 Cough: Secondary | ICD-10-CM

## 2012-02-24 DIAGNOSIS — S301XXA Contusion of abdominal wall, initial encounter: Secondary | ICD-10-CM

## 2012-02-24 MED ORDER — HYDROCODONE-HOMATROPINE 5-1.5 MG/5ML PO SYRP: 5.0000 mL | ORAL_SOLUTION | Freq: Four times a day (QID) | ORAL | Status: AC | PRN

## 2012-02-24 NOTE — Progress Notes (Signed)
  Subjective:    Patient ID: Tammy [name], female    DOB: 1960/10/24, 51 y.o.   MRN: [id number]  HPI  Acute visit. Right upper abdominal pain. Occurred after bruising area when falling onto door handle this past Tuesday 2 days ago. She's had some topical bruising. Taken over-the-counter analgesics without much improvement. Pain is moderate and worse with movement. Also has had 3 days of cough mostly dry. No associated fever. No dyspnea. No hemoptysis. Has some postnasal drip. Previously has taken Hydromet cough syrup and requesting refill. No relief with over-the-counter medicines.   Review of Systems  Constitutional: Negative for fever, chills, activity change and fatigue.  Respiratory: Positive for cough. Negative for shortness of breath and wheezing.   Cardiovascular: Negative for chest pain and leg swelling.  Gastrointestinal: Negative for blood in stool.  Genitourinary: Negative for hematuria.  Neurological: Negative for dizziness and syncope.       Objective:   Physical Exam  Constitutional: She appears well-developed and well-nourished. No distress.  Cardiovascular: Normal rate and regular rhythm.   Pulmonary/Chest: Effort normal and breath sounds normal. No respiratory distress. She has no wheezes. She has no rales.  Abdominal: Soft. She exhibits no mass. There is no guarding.       Minimal bruising right upper abdominal quadrant. No hematoma. Minimally tender to palpation. No hepatomegaly or splenomegaly.          Assessment & Plan:  #1 ecchymosis right upper abdominal wall. Reassurance. No indication for imaging at this time #2 dry cough. Suspect viral. Refill Hycodan cough syrup

## 2012-03-15 ENCOUNTER — Other Ambulatory Visit: Payer: Self-pay | Admitting: Family Medicine

## 2012-03-15 NOTE — Telephone Encounter (Signed)
Dr. Swords pt 

## 2012-04-03 ENCOUNTER — Other Ambulatory Visit: Payer: Self-pay | Admitting: Internal Medicine

## 2012-04-15 ENCOUNTER — Emergency Department (HOSPITAL_BASED_OUTPATIENT_CLINIC_OR_DEPARTMENT_OTHER)
Admission: EM | Admit: 2012-04-15 | Discharge: 2012-04-16 | Disposition: A | Discharge status: Home / Self Care | Payer: Self-pay | Attending: Emergency Medicine | Admitting: Emergency Medicine

## 2012-04-15 ENCOUNTER — Encounter (HOSPITAL_BASED_OUTPATIENT_CLINIC_OR_DEPARTMENT_OTHER): Payer: Self-pay | Admitting: *Deleted

## 2012-04-15 ENCOUNTER — Emergency Department (HOSPITAL_BASED_OUTPATIENT_CLINIC_OR_DEPARTMENT_OTHER): Payer: Self-pay

## 2012-04-15 DIAGNOSIS — N2882 Megaloureter: Secondary | ICD-10-CM

## 2012-04-15 DIAGNOSIS — Z7982 Long term (current) use of aspirin: Secondary | ICD-10-CM | POA: Insufficient documentation

## 2012-04-15 DIAGNOSIS — E785 Hyperlipidemia, unspecified: Secondary | ICD-10-CM | POA: Insufficient documentation

## 2012-04-15 DIAGNOSIS — I10 Essential (primary) hypertension: Secondary | ICD-10-CM | POA: Insufficient documentation

## 2012-04-15 DIAGNOSIS — F17200 Nicotine dependence, unspecified, uncomplicated: Secondary | ICD-10-CM | POA: Insufficient documentation

## 2012-04-15 DIAGNOSIS — N2889 Other specified disorders of kidney and ureter: Secondary | ICD-10-CM | POA: Insufficient documentation

## 2012-04-15 LAB — CBC WITH DIFFERENTIAL/PLATELET
Eosinophils Absolute: 0.3 10*3/uL (ref 0.0–0.7)
Eosinophils Relative: 4 % (ref 0–5)
Hemoglobin: 15 g/dL (ref 12.0–15.0)
Lymphs Abs: 3.2 10*3/uL (ref 0.7–4.0)
MCH: 34.7 pg — ABNORMAL HIGH (ref 26.0–34.0)
MCV: 94.9 fL (ref 78.0–100.0)
Monocytes Relative: 7 % (ref 3–12)
RBC: 4.32 MIL/uL (ref 3.87–5.11)

## 2012-04-15 LAB — COMPREHENSIVE METABOLIC PANEL
ALT: 18 U/L (ref 0–35)
BUN: 18 mg/dL (ref 6–23)
Calcium: 9.8 mg/dL (ref 8.4–10.5)
GFR calc non Af Amer: 85 mL/min — ABNORMAL LOW (ref >90)
Glucose, Bld: 95 mg/dL (ref 70–99)
Total Bilirubin: 0.2 mg/dL — ABNORMAL LOW (ref 0.3–1.2)
Total Protein: 7.3 g/dL (ref 6.0–8.3)

## 2012-04-15 LAB — URINALYSIS, ROUTINE W REFLEX MICROSCOPIC
Bilirubin Urine: NEGATIVE
Leukocytes, UA: NEGATIVE
Nitrite: NEGATIVE
Specific Gravity, Urine: 1.019 (ref 1.005–1.030)
Urobilinogen, UA: 0.2 mg/dL (ref 0.0–1.0)

## 2012-04-15 MED ORDER — HYDROMORPHONE HCL PF 1 MG/ML IJ SOLN
1.0000 mg | Freq: Once | INTRAMUSCULAR | Status: AC
  Administered 2012-04-15: 1 mg via INTRAVENOUS
  Filled 2012-04-15: qty 1

## 2012-04-15 MED ORDER — SODIUM CHLORIDE 0.9 % IV BOLUS (SEPSIS)
1000.0000 mL | Freq: Once | INTRAVENOUS | Status: AC
  Administered 2012-04-15: 1000 mL via INTRAVENOUS

## 2012-04-15 MED ORDER — ONDANSETRON HCL 4 MG/2ML IJ SOLN
4.0000 mg | Freq: Once | INTRAMUSCULAR | Status: AC
  Administered 2012-04-15: 4 mg via INTRAVENOUS
  Filled 2012-04-15: qty 2

## 2012-04-15 MED ORDER — KETOROLAC TROMETHAMINE 30 MG/ML IJ SOLN
30.0000 mg | Freq: Once | INTRAMUSCULAR | Status: AC
  Administered 2012-04-15: 30 mg via INTRAVENOUS
  Filled 2012-04-15: qty 1

## 2012-04-15 NOTE — ED Provider Notes (Signed)
History   This chart was scribed for Loren Racer, MD scribed by Magnus Sinning. The patient was seen in room MH02/MH02 seen 23:00   CSN: [id number]  Arrival date & time 04/15/12  2223   First MD Initiated Contact with Patient 04/15/12 2300      Chief Complaint  Patient presents with  . Abdominal Pain    (Consider location/radiation/quality/duration/timing/severity/associated sxs/prior treatment) HPI Tammy [name] is a 51 y.o. female who presents to the Emergency Department complaining of constant moderate lower abd pain, onset yesterday. She reports hx of kidney stones and believes today's pain is related. She says that she has passed several kidney stones in the past and one earlier today. Reports hematuria, but denies hematochezia. Also reports hx of surgical removal of kidney stones. Urologist: [facility]  Past Medical History  Diagnosis Date  . HYPERLIPIDEMIA 02/22/2008  . HYPERTENSION 02/22/2008    Past Surgical History  Procedure Date  . Tubal ligation 2006  . Induced abortion age 23  . Kidney stone surgery 2011     [facility] Urology, right percutaneous  . Endometrial ablation     Family History  Problem Relation Age of Onset  . Cirrhosis Mother 28    non alcoholic  . Cancer Father 70    bladder    History  Substance Use Topics  . Smoking status: Current Everyday Smoker -- 1.0 packs/day for 30 years    Types: Cigarettes  . Smokeless tobacco: Not on file  . Alcohol Use: No    Review of Systems 10 Systems reviewed and are negative for acute change except as noted in the HPI. Allergies  Varenicline tartrate  Home Medications   Current Outpatient Rx  Name Route Sig Dispense Refill  . ASPIRIN 81 MG PO TABS Oral Take 81 mg by mouth daily.      Marland Kitchen CLONAZEPAM 0.5 MG PO TABS Oral Take 0.25 mg by mouth daily.    Marland Kitchen FLUTICASONE PROPIONATE 50 MCG/ACT NA SUSP Nasal Place 1 spray into the nose daily as needed.    Marland Kitchen HYDROCHLOROTHIAZIDE 12.5 MG PO CAPS Oral Take 12.5  mg by mouth daily.      Marland Kitchen HYDROCODONE-ACETAMINOPHEN 5-500 MG PO TABS Oral Take 1 tablet by mouth every 4 (four) hours as needed for pain. 20 tablet 0    NEEDS OV  . IBUPROFEN 200 MG PO TABS Oral Take 400 mg by mouth every 6 (six) hours as needed. For pain    . METOPROLOL TARTRATE 50 MG PO TABS  TAKE 1 TABLET BY MOUTH TWICE A DAY 180 tablet 0  . POTASSIUM CITRATE ER 10 MEQ (1080 MG) PO TBCR Oral Take 10 mEq by mouth 3 (three) times daily with meals.    . AMOXICILLIN 500 MG PO TABS Oral Take 500 mg by mouth 3 (three) times daily. For 10 days completed 04/15/12    . IBUPROFEN 600 MG PO TABS Oral Take 1 tablet (600 mg total) by mouth every 6 (six) hours as needed for pain. 30 tablet 0  . TRAMADOL HCL 50 MG PO TABS Oral Take 1 tablet (50 mg total) by mouth every 6 (six) hours as needed for pain. 15 tablet 0    BP 127/63  Pulse 72  Temp 97.8 F (36.6 C) (Oral)  Resp 24  Ht 5\' 1"  (1.549 m)  Wt 150 lb (68.04 kg)  BMI 28.34 kg/m2  SpO2 98%  Physical Exam  Nursing note and vitals reviewed. Constitutional: She is oriented to person, place,  and time. She appears well-developed and well-nourished. She appears distressed.  HENT:  Head: Normocephalic and atraumatic.  Eyes: EOM are normal. Pupils are equal, round, and reactive to light.  Neck: Neck supple. No tracheal deviation present.  Cardiovascular: Normal rate.   Pulmonary/Chest: Effort normal. No respiratory distress.  Abdominal: Soft. Bowel sounds are normal. She exhibits no distension and no mass. There is no tenderness. There is no rebound and no guarding.       No flank tenderness.  Musculoskeletal: Normal range of motion. She exhibits no edema.  Neurological: She is alert and oriented to person, place, and time. No sensory deficit.  Skin: Skin is warm and dry.  Psychiatric: She has a normal mood and affect. Her behavior is normal.    ED Course  Procedures (including critical care time) DIAGNOSTIC STUDIES: Oxygen Saturation is 98%  on room air, normal by my interpretation.    COORDINATION OF CARE:  Labs Reviewed  CBC WITH DIFFERENTIAL - Abnormal; Notable for the following:    MCH 34.7 (*)     MCHC 36.6 (*)     All other components within normal limits  COMPREHENSIVE METABOLIC PANEL - Abnormal; Notable for the following:    Total Bilirubin 0.2 (*)     GFR calc non Af Amer 85 (*)     All other components within normal limits  URINALYSIS, ROUTINE W REFLEX MICROSCOPIC  PREGNANCY, URINE   Ct Abdomen Pelvis Wo Contrast  04/16/2012  *RADIOLOGY REPORT*  Clinical Data: Lower abdominal pain, hematuria, history kidney stones, hypertension  CT ABDOMEN AND PELVIS WITHOUT CONTRAST  Technique:  Multidetector CT imaging of the abdomen and pelvis was performed following the standard protocol without intravenous contrast. Sagittal and coronal MPR images reconstructed from axial data set.  Comparison: 12/26/2007  Findings: Lung bases clear. No definite urinary tract calcification identified. Mildly dilated proximal right ureter with minimal prominence of the right renal collecting system, slightly decreased from previous study. Probable small cyst right kidney 1.3 cm diameter. Within limits of a nonenhanced exam, no additional focal abnormalities of the liver, spleen, pancreas, or adrenal glands.  Normal appearance of bladder, uterus and adnexae. Normal appendix. Minimal scattered atherosclerotic calcification. Sigmoid diverticulosis without evidence of diverticulitis. Bowel loops otherwise normal appearance. No mass, adenopathy, free fluid or inflammatory process. No acute osseous findings.  IMPRESSION: Mild prominence of right renal collecting system and proximal right ureter, slightly decreased versus previous exam. No urinary tract calcification identified. New small right renal cyst. Sigmoid diverticulosis. No definite acute intra abdominal or intrapelvic abnormalities.  Original Report Authenticated By: Lollie Marrow, M.D.     1.  Ureteric dilatation, right       MDM  I personally performed the services described in this documentation, which was scribed in my presence. The recorded information has been reviewed and considered.  Pt more comfortable. Concern for exaggerated symptoms and narcotic seeking behavior. Advised to f/u with her urologist       Loren Racer, MD 04/16/12 [phone]

## 2012-04-15 NOTE — ED Notes (Addendum)
Pt states she has a hx of stones and this feels like one. States she passed one earlier, but believes there is another one. C/O low abd pain with N/V

## 2012-04-16 MED ORDER — TRAMADOL HCL 50 MG PO TABS
ORAL_TABLET | ORAL | Status: AC
  Administered 2012-04-16: 50 mg via ORAL
  Filled 2012-04-16: qty 1

## 2012-04-16 MED ORDER — TRAMADOL HCL 50 MG PO TABS: 50.0000 mg | ORAL_TABLET | Freq: Four times a day (QID) | ORAL | Status: AC | PRN

## 2012-04-16 MED ORDER — TRAMADOL HCL 50 MG PO TABS
50.0000 mg | ORAL_TABLET | Freq: Once | ORAL | Status: AC
  Administered 2012-04-16: 50 mg via ORAL

## 2012-04-16 MED ORDER — IBUPROFEN 600 MG PO TABS: 600.0000 mg | ORAL_TABLET | Freq: Four times a day (QID) | ORAL | Status: AC | PRN

## 2012-04-20 ENCOUNTER — Emergency Department (HOSPITAL_BASED_OUTPATIENT_CLINIC_OR_DEPARTMENT_OTHER)
Admission: EM | Admit: 2012-04-20 | Discharge: 2012-04-20 | Disposition: A | Discharge status: Home / Self Care | Payer: Self-pay | Attending: Emergency Medicine | Admitting: Emergency Medicine

## 2012-04-20 ENCOUNTER — Encounter (HOSPITAL_BASED_OUTPATIENT_CLINIC_OR_DEPARTMENT_OTHER): Payer: Self-pay | Admitting: *Deleted

## 2012-04-20 DIAGNOSIS — I10 Essential (primary) hypertension: Secondary | ICD-10-CM | POA: Insufficient documentation

## 2012-04-20 DIAGNOSIS — R109 Unspecified abdominal pain: Secondary | ICD-10-CM | POA: Insufficient documentation

## 2012-04-20 DIAGNOSIS — Z79899 Other long term (current) drug therapy: Secondary | ICD-10-CM | POA: Insufficient documentation

## 2012-04-20 DIAGNOSIS — E785 Hyperlipidemia, unspecified: Secondary | ICD-10-CM | POA: Insufficient documentation

## 2012-04-20 DIAGNOSIS — F17200 Nicotine dependence, unspecified, uncomplicated: Secondary | ICD-10-CM | POA: Insufficient documentation

## 2012-04-20 HISTORY — DX: Calculus of kidney: N20.0

## 2012-04-20 LAB — URINALYSIS, ROUTINE W REFLEX MICROSCOPIC
Glucose, UA: NEGATIVE mg/dL
Hgb urine dipstick: NEGATIVE
Ketones, ur: NEGATIVE mg/dL
Leukocytes, UA: NEGATIVE
Protein, ur: NEGATIVE mg/dL
pH: 6 (ref 5.0–8.0)

## 2012-04-20 MED ORDER — OXYCODONE-ACETAMINOPHEN 5-325 MG PO TABS: 2.0000 | ORAL_TABLET | ORAL | Status: AC | PRN

## 2012-04-20 MED ORDER — HYDROMORPHONE HCL PF 2 MG/ML IJ SOLN: 2.0000 mg | Freq: Once | INTRAMUSCULAR | Status: DC

## 2012-04-20 MED ORDER — KETOROLAC TROMETHAMINE 60 MG/2ML IM SOLN
60.0000 mg | Freq: Once | INTRAMUSCULAR | Status: AC
Start: 2012-04-20 — End: 2012-04-20
  Administered 2012-04-20: 60 mg via INTRAMUSCULAR
  Filled 2012-04-20: qty 2

## 2012-04-20 MED ORDER — ONDANSETRON 4 MG PO TBDP: 4.0000 mg | ORAL_TABLET | Freq: Three times a day (TID) | ORAL | Status: AC | PRN

## 2012-04-20 MED ORDER — HYDROMORPHONE HCL PF 2 MG/ML IJ SOLN
2.0000 mg | Freq: Once | INTRAMUSCULAR | Status: AC
  Administered 2012-04-20: 2 mg via INTRAMUSCULAR
  Filled 2012-04-20: qty 1

## 2012-04-20 NOTE — ED Provider Notes (Signed)
History     CSN: [id number]  Arrival date & time 04/20/12  1312   First MD Initiated Contact with Patient 04/20/12 1350      Chief Complaint  Patient presents with  . Abdominal Pain    (Consider location/radiation/quality/duration/timing/severity/associated sxs/prior treatment) Patient is a 51 y.o. female presenting with flank pain. The history is provided by the patient. No language interpreter was used.  Flank Pain This is a recurrent problem. The current episode started yesterday. The problem occurs constantly. The problem has been unchanged. Associated symptoms include abdominal pain. Nothing aggravates the symptoms. She has tried nothing for the symptoms. The treatment provided moderate relief.   Pt complains of pain in her low back and her abdomen.  Pt reports she passed 2 stones yesterday and 2 stones today.  (Pt had ct on 6/26 that showed no evidence of stones.)  Pt reports her urologist told her that her stones do not show up on ct scans. Past Medical History  Diagnosis Date  . HYPERLIPIDEMIA 02/22/2008  . HYPERTENSION 02/22/2008  . Kidney stones     Past Surgical History  Procedure Date  . Tubal ligation 2006  . Induced abortion age 59  . Kidney stone surgery 2011     [facility] Urology, right percutaneous  . Endometrial ablation     Family History  Problem Relation Age of Onset  . Cirrhosis Mother 83    non alcoholic  . Cancer Father 67    bladder    History  Substance Use Topics  . Smoking status: Current Everyday Smoker -- 1.0 packs/day for 30 years    Types: Cigarettes  . Smokeless tobacco: Not on file  . Alcohol Use: No    OB History    Grav Para Term Preterm Abortions TAB SAB Ect Mult Living                  Review of Systems  Gastrointestinal: Positive for abdominal pain.  Genitourinary: Positive for flank pain.  All other systems reviewed and are negative.    Allergies  Varenicline tartrate  Home Medications   Current Outpatient Rx    Name Route Sig Dispense Refill  . AMOXICILLIN 500 MG PO TABS Oral Take 500 mg by mouth 3 (three) times daily. For 10 days completed 04/15/12    . ASPIRIN 81 MG PO TABS Oral Take 81 mg by mouth daily.      Marland Kitchen CLONAZEPAM 0.5 MG PO TABS Oral Take 0.25 mg by mouth daily.    Marland Kitchen FLUTICASONE PROPIONATE 50 MCG/ACT NA SUSP Nasal Place 1 spray into the nose daily as needed.    Marland Kitchen HYDROCHLOROTHIAZIDE 12.5 MG PO CAPS Oral Take 12.5 mg by mouth daily.      Marland Kitchen HYDROCODONE-ACETAMINOPHEN 5-500 MG PO TABS Oral Take 1 tablet by mouth every 4 (four) hours as needed for pain. 20 tablet 0    NEEDS OV  . IBUPROFEN 200 MG PO TABS Oral Take 400 mg by mouth every 6 (six) hours as needed. For pain    . IBUPROFEN 600 MG PO TABS Oral Take 1 tablet (600 mg total) by mouth every 6 (six) hours as needed for pain. 30 tablet 0  . METOPROLOL TARTRATE 50 MG PO TABS  TAKE 1 TABLET BY MOUTH TWICE A DAY 180 tablet 0  . POTASSIUM CITRATE ER 10 MEQ (1080 MG) PO TBCR Oral Take 10 mEq by mouth 3 (three) times daily with meals.    . TRAMADOL HCL 50 MG PO  TABS Oral Take 1 tablet (50 mg total) by mouth every 6 (six) hours as needed for pain. 15 tablet 0    BP 133/67  Pulse 72  Resp 20  SpO2 98%  Physical Exam  Nursing note and vitals reviewed. Constitutional: She appears well-developed and well-nourished.  HENT:  Head: Normocephalic and atraumatic.  Cardiovascular: Normal rate.   Pulmonary/Chest: Effort normal.  Abdominal: Soft.  Musculoskeletal: Normal range of motion.  Neurological: She is alert.  Skin: Skin is warm.  Psychiatric: She has a normal mood and affect.    ED Course  Procedures (including critical care time)   Labs Reviewed  PREGNANCY, URINE  URINALYSIS, ROUTINE W REFLEX MICROSCOPIC   No results found.   1. Flank pain       MDM  Pt given rx for percocet.   I advised pt to go to [facility] to her urologist if pain persist.         Lonia Skinner [place], [place] 04/20/12 1453

## 2012-04-20 NOTE — ED Notes (Signed)
States she passed 2 kidney stones last night and 2 Sat night. Complain of pain to her right back and left lower quadrant. States she goes to [facility] for treatment for kidney stones.

## 2012-04-21 NOTE — ED Provider Notes (Signed)
Medical screening examination/treatment/procedure(s) were performed by non-physician practitioner and as supervising physician I was immediately available for consultation/collaboration.    Gabbrielle Mcnicholas W Calen Geister, MD 04/21/12 0653 

## 2012-05-08 ENCOUNTER — Ambulatory Visit (INDEPENDENT_AMBULATORY_CARE_PROVIDER_SITE_OTHER): Payer: Self-pay | Admitting: Family Medicine

## 2012-05-08 ENCOUNTER — Encounter: Payer: Self-pay | Admitting: Family Medicine

## 2012-05-08 VITALS — BP 100/70 | Temp 99.0°F | Wt 147.0 lb

## 2012-05-08 DIAGNOSIS — N949 Unspecified condition associated with female genital organs and menstrual cycle: Secondary | ICD-10-CM

## 2012-05-08 DIAGNOSIS — R197 Diarrhea, unspecified: Secondary | ICD-10-CM

## 2012-05-08 DIAGNOSIS — R102 Pelvic and perineal pain: Secondary | ICD-10-CM

## 2012-05-08 NOTE — Patient Instructions (Addendum)
Diarrhea Infections caused by germs (bacterial) or a virus commonly cause diarrhea. Your caregiver has determined that with time, rest and fluids, the diarrhea should improve. In general, eat normally while drinking more water than usual. Although water may prevent dehydration, it does not contain salt and minerals (electrolytes). Broths, weak tea without caffeine and oral rehydration solutions (ORS) replace fluids and electrolytes. Small amounts of fluids should be taken frequently. Large amounts at one time may not be tolerated. Plain water may be harmful in infants and the elderly. Oral rehydrating solutions (ORS) are available at pharmacies and grocery stores. ORS replace water and important electrolytes in proper proportions. Sports drinks are not as effective as ORS and may be harmful due to sugars worsening diarrhea.  ORS is especially recommended for use in children with diarrhea. As a general guideline for children, replace any new fluid losses from diarrhea and/or vomiting with ORS as follows:   If your child weighs 22 pounds or under (10 kg or less), give 60-120 mL ( -  cup or 2 - 4 ounces) of ORS for each episode of diarrheal stool or vomiting episode.   If your child weighs more than 22 pounds (more than 10 kgs), give 120-240 mL ( - 1 cup or 4 - 8 ounces) of ORS for each diarrheal stool or episode of vomiting.   While correcting for dehydration, children should eat normally. However, foods high in sugar should be avoided because this may worsen diarrhea. Large amounts of carbonated soft drinks, juice, gelatin desserts and other highly sugared drinks should be avoided.   After correction of dehydration, other liquids that are appealing to the child may be added. Children should drink small amounts of fluids frequently and fluids should be increased as tolerated. Children should drink enough fluids to keep urine clear or pale yellow.   Adults should eat normally while drinking more fluids  than usual. Drink small amounts of fluids frequently and increase as tolerated. Drink enough fluids to keep urine clear or pale yellow. Broths, weak decaffeinated tea, lemon lime soft drinks (allowed to go flat) and ORS replace fluids and electrolytes.   Avoid:   Carbonated drinks.   Juice.   Extremely hot or cold fluids.   Caffeine drinks.   Fatty, greasy foods.   Alcohol.   Tobacco.   Too much intake of anything at one time.   Gelatin desserts.   Probiotics are active cultures of beneficial bacteria. They may lessen the amount and number of diarrheal stools in adults. Probiotics can be found in yogurt with active cultures and in supplements.   Wash hands well to avoid spreading bacteria and virus.   Anti-diarrheal medications are not recommended for infants and children.   Only take over-the-counter or prescription medicines for pain, discomfort or fever as directed by your caregiver. Do not give aspirin to children because it may cause Reye's Syndrome.   For adults, ask your caregiver if you should continue all prescribed and over-the-counter medicines.   If your caregiver has given you a follow-up appointment, it is very important to keep that appointment. Not keeping the appointment could result in a chronic or permanent injury, and disability. If there is any problem keeping the appointment, you must call back to this facility for assistance.  SEEK IMMEDIATE MEDICAL CARE IF:   You or your child is unable to keep fluids down or other symptoms or problems become worse in spite of treatment.   Vomiting or diarrhea develops and becomes persistent.     There is vomiting of blood or bile (green material).   There is blood in the stool or the stools are black and tarry.   There is no urine output in 6-8 hours or there is only a small amount of very dark urine.   Abdominal pain develops, increases or localizes.   You have a fever.   Your baby is older than 3 months with a  rectal temperature of 102 F (38.9 C) or higher.   Your baby is 3 months old or younger with a rectal temperature of 100.4 F (38 C) or higher.   You or your child develops excessive weakness, dizziness, fainting or extreme thirst.   You or your child develops a rash, stiff neck, severe headache or become irritable or sleepy and difficult to awaken.  MAKE SURE YOU:   Understand these instructions.   Will watch your condition.   Will get help right away if you are not doing well or get worse.  Document Released: 09/24/2002 Document Revised: 09/23/2011 Document Reviewed: 08/11/2009 [employer] Patient Information 2012 [employer]. 

## 2012-05-08 NOTE — Progress Notes (Signed)
  Subjective:    Patient ID: Tammy [name], female    DOB: 02/09/1961, 51 y.o.   MRN: [id number]  HPI  Patient seen with intermittent diarrhea for past few weeks. She did take an antibiotic briefly with amoxicillin per dentist a month ago. Has mild intermittent nausea with no vomiting. No specific abdominal pain. Stools are actually decreased some since week ago. She's had about 2-3 diarrhea stools per week for the past month. No fever or chills. No bloody stools. No appetite or weight changes. She had good appetite. Questionable history of lactose intolerance previously. No recent dietary changes. She has history of frequent kidney stones.  Also compains of 3-4 day hx of pain L adnexal region.  Previous uterine thermal ablation, so does not  have menses.  No vaginal discharge.  No dysuria.  CT abd/pelvis in June no acute abnormality.  Past Medical History  Diagnosis Date  . HYPERLIPIDEMIA 02/22/2008  . HYPERTENSION 02/22/2008  . Kidney stones    Past Surgical History  Procedure Date  . Tubal ligation 2006  . Induced abortion age 48  . Kidney stone surgery 2011     [facility]rology, right percutaneous  . Endometrial ablation     reports that she has been smoking Cigarettes.  She has a 30 pack-year smoking history. She does not have any smokeless tobacco history on file. She reports that she does not drink alcohol or use illicit drugs. family history includes Cancer (age of onset:85) in her father and Cirrhosis (age of onset:62) in her mother. Allergies  Allergen Reactions  . Varenicline Tartrate Other (See Comments)    Suicidal thoughts (Chantix)      Review of Systems  Constitutional: Negative for fever, chills, appetite change and unexpected weight change.  Gastrointestinal: Positive for nausea and diarrhea. Negative for abdominal pain and constipation.  Genitourinary: Positive for pelvic pain. Negative for dysuria, frequency, hematuria, flank pain, vaginal bleeding, vaginal  discharge, genital sores and menstrual problem.  Neurological: Negative for dizziness.  Hematological: Negative for adenopathy.       Objective:   Physical Exam  Constitutional: She appears well-developed and well-nourished.  HENT:  Mouth/Throat: Oropharynx is clear and moist.  Neck: Neck supple.  Cardiovascular: Normal rate and regular rhythm.   Pulmonary/Chest: Effort normal and breath sounds normal. No respiratory distress. She has no wheezes. She has no rales.  Abdominal: Soft. Bowel sounds are normal. She exhibits no distension and no mass. There is no tenderness. There is no rebound and no guarding.  Genitourinary: Vagina normal. No vaginal discharge found.       Bimanual exam L adnexal tenderness which is mild.  No mass.          Assessment & Plan:  Diarrhea. Questionably residual from recent viral illness. Appears to be resolving by history. Be in touch if this increases in frequency. This point we elected not to do any further evaluation with studies since seems to be resolving. Left adnexal pain with nonfocal exam and recent CT pelvis unremarkable few weeks ago-observe and f/u with primary if persists.

## 2012-06-23 ENCOUNTER — Encounter: Payer: Self-pay | Admitting: Internal Medicine

## 2012-07-11 ENCOUNTER — Other Ambulatory Visit: Payer: Self-pay | Admitting: Internal Medicine

## 2012-07-17 ENCOUNTER — Ambulatory Visit (INDEPENDENT_AMBULATORY_CARE_PROVIDER_SITE_OTHER): Payer: Self-pay | Admitting: Internal Medicine

## 2012-07-17 ENCOUNTER — Encounter: Payer: Self-pay | Admitting: Internal Medicine

## 2012-07-17 VITALS — BP 116/80 | HR 76 | Temp 98.1°F | Ht 62.25 in | Wt 145.0 lb

## 2012-07-17 DIAGNOSIS — Z0000 Encounter for general adult medical examination without abnormal findings: Secondary | ICD-10-CM

## 2012-07-17 DIAGNOSIS — Z23 Encounter for immunization: Secondary | ICD-10-CM

## 2012-07-17 LAB — HEPATIC FUNCTION PANEL
ALT: 26 U/L (ref 0–35)
AST: 23 U/L (ref 0–37)
Alkaline Phosphatase: 67 U/L (ref 39–117)
Bilirubin, Direct: 0 mg/dL (ref 0.0–0.3)
Total Bilirubin: 0.7 mg/dL (ref 0.3–1.2)

## 2012-07-17 LAB — CBC WITH DIFFERENTIAL/PLATELET
Eosinophils Relative: 2.8 % (ref 0.0–5.0)
HCT: 47.9 % — ABNORMAL HIGH (ref 36.0–46.0)
Hemoglobin: 16.4 g/dL — ABNORMAL HIGH (ref 12.0–15.0)
Lymphs Abs: 2.3 10*3/uL (ref 0.7–4.0)
MCV: 98.3 fl (ref 78.0–100.0)
Monocytes Absolute: 0.4 10*3/uL (ref 0.1–1.0)
Monocytes Relative: 5.1 % (ref 3.0–12.0)
Neutro Abs: 4.1 10*3/uL (ref 1.4–7.7)
RDW: 12.5 % (ref 11.5–14.6)
WBC: 7 10*3/uL (ref 4.5–10.5)

## 2012-07-17 LAB — BASIC METABOLIC PANEL
CO2: 26 mEq/L (ref 19–32)
Calcium: 10 mg/dL (ref 8.4–10.5)
Creatinine, Ser: 0.9 mg/dL (ref 0.4–1.2)
GFR: 68.83 mL/min (ref >60.00)
Glucose, Bld: 89 mg/dL (ref 70–99)

## 2012-07-17 LAB — POCT URINALYSIS DIPSTICK
Blood, UA: NEGATIVE
Ketones, UA: NEGATIVE
Leukocytes, UA: NEGATIVE
Spec Grav, UA: 1.025
pH, UA: 6

## 2012-07-17 NOTE — Progress Notes (Signed)
Patient ID: Tammy [name], female   DOB: October 06, 1961, 51 y.o.   MRN: [id number] cpx  Has had recurrent kidney stones-- sees urology at [facility]  Past Medical History  Diagnosis Date  . HYPERLIPIDEMIA 02/22/2008  . HYPERTENSION 02/22/2008  . Kidney stones     History   Social History  . Marital Status: Divorced    Spouse Name: N/A    Number of Children: N/A  . Years of Education: N/A   Occupational History  . Not on file.   Social History Main Topics  . Smoking status: Current Every Day Smoker -- 1.0 packs/day for 30 years    Types: Cigarettes  . Smokeless tobacco: Not on file  . Alcohol Use: No  . Drug Use: No  . Sexually Active: Not on file   Other Topics Concern  . Not on file   Social History Narrative  . No narrative on file    Past Surgical History  Procedure Date  . Tubal ligation 2006  . Induced abortion age 23  . Kidney stone surgery 2011     [facility], right percutaneous  . Endometrial ablation     Family History  Problem Relation Age of Onset  . Cirrhosis Mother 26    non alcoholic  . Cancer Father 9    bladder    Allergies  Allergen Reactions  . Varenicline Tartrate Other (See Comments)    Suicidal thoughts (Chantix)    Current Outpatient Prescriptions on File Prior to Visit  Medication Sig Dispense Refill  . aspirin 81 MG tablet Take 81 mg by mouth daily.        . clonazePAM (KLONOPIN) 0.5 MG tablet Take 0.25 mg by mouth daily as needed.       . fluticasone (FLONASE) 50 MCG/ACT nasal spray Place 1 spray into the nose daily as needed.      . hydrochlorothiazide (,MICROZIDE/HYDRODIURIL,) 12.5 MG capsule Take 12.5 mg by mouth daily.        Marland Kitchen HYDROcodone-acetaminophen (VICODIN) 5-500 MG per tablet Take 1 tablet by mouth every 4 (four) hours as needed for pain.  20 tablet  0  . ibuprofen (ADVIL,MOTRIN) 200 MG tablet Take 400 mg by mouth every 6 (six) hours as needed. For pain      . metoprolol (LOPRESSOR) 50 MG tablet TAKE 1 TABLET BY MOUTH  TWICE A DAY  180 tablet  0  . potassium citrate (UROCIT-K) 10 MEQ (1080 MG) SR tablet Take 10 mEq by mouth 3 (three) times daily with meals.         patient denies chest pain, shortness of breath, orthopnea. Denies lower extremity edema, abdominal pain, change in appetite, change in bowel movements. Patient denies rashes, musculoskeletal complaints. No other specific complaints in a complete review of systems.   BP 116/80  Pulse 76  Temp 98.1 F (36.7 C) (Oral)  Ht 5' 2.25" (1.581 m)  Wt 145 lb (65.772 kg)  BMI 26.31 kg/m2  Well-developed well-nourished female in no acute distress. HEENT exam atraumatic, normocephalic, extraocular muscles are intact. Neck is supple. No jugular venous distention no thyromegaly. Chest clear to auscultation without increased work of breathing. Cardiac exam S1 and S2 are regular. Abdominal exam active bowel sounds, soft, nontender. Extremities no edema. Neurologic exam she is alert without any motor sensory deficits. Gait is normal.  A/p: well visit- health maint UTD

## 2012-07-20 ENCOUNTER — Telehealth: Payer: Self-pay | Admitting: Internal Medicine

## 2012-07-20 NOTE — Telephone Encounter (Signed)
Caller: Junice/Patient; Patient Name: Tammy [name]; PCP: Birdie Sons (Adults only); Best Callback Phone Number: [phone] Onset 07/20/12 Patient calling to check recommended dosage of Cholesterol medication.  Informed patient per Epic note in lab values that Recommended dosage was Lipitor 20mg  po daily.  Patient will follow up if any further assistance is needed.

## 2012-07-21 ENCOUNTER — Encounter: Payer: Self-pay | Admitting: Internal Medicine

## 2012-07-26 ENCOUNTER — Other Ambulatory Visit: Payer: Self-pay | Admitting: Internal Medicine

## 2012-08-29 ENCOUNTER — Ambulatory Visit (INDEPENDENT_AMBULATORY_CARE_PROVIDER_SITE_OTHER): Payer: Self-pay | Admitting: Family

## 2012-08-29 ENCOUNTER — Encounter: Payer: Self-pay | Admitting: Family

## 2012-08-29 VITALS — BP 100/72 | HR 95 | Temp 98.3°F | Wt 146.0 lb

## 2012-08-29 DIAGNOSIS — J019 Acute sinusitis, unspecified: Secondary | ICD-10-CM

## 2012-08-29 MED ORDER — AMOXICILLIN-POT CLAVULANATE 875-125 MG PO TABS: 1.0000 | ORAL_TABLET | Freq: Two times a day (BID) | ORAL | Status: DC

## 2012-08-29 MED ORDER — METHYLPREDNISOLONE 4 MG PO KIT: PACK | ORAL | Status: AC

## 2012-08-29 NOTE — Progress Notes (Signed)
Subjective:    Patient ID: Tammy [name], female    DOB: 24-Oct-1960, 51 y.o.   MRN: [id number]  HPI 51 year old white female, electric cigarette smoker, patient of Dr. Cato Mulligan is in today with complaints of sneezing, cough, congestion, sinus pressure and pain that is worsened over the last 2 weeks. She's been taking Allegra-D is no longer helping her symptoms.   Review of Systems  Constitutional: Positive for chills and fatigue.  HENT: Positive for congestion, sore throat, sneezing, postnasal drip and sinus pressure.   Eyes: Negative.   Respiratory: Positive for cough. Negative for chest tightness and wheezing.   Cardiovascular: Negative.   Gastrointestinal: Negative.   Musculoskeletal: Negative.   Skin: Negative.   Neurological: Negative.   Hematological: Negative.   Psychiatric/Behavioral: Negative.    Past Medical History  Diagnosis Date  . HYPERLIPIDEMIA 02/22/2008  . HYPERTENSION 02/22/2008  . Kidney stones     History   Social History  . Marital Status: Divorced    Spouse Name: N/A    Number of Children: N/A  . Years of Education: N/A   Occupational History  . Not on file.   Social History Main Topics  . Smoking status: Current Every Day Smoker -- 1.0 packs/day for 30 years    Types: Cigarettes  . Smokeless tobacco: Not on file  . Alcohol Use: No  . Drug Use: No  . Sexually Active: Not on file   Other Topics Concern  . Not on file   Social History Narrative  . No narrative on file    Past Surgical History  Procedure Date  . Tubal ligation 2006  . Induced abortion age 12  . Kidney stone surgery 2011     [facility] Urology, right percutaneous  . Endometrial ablation     Family History  Problem Relation Age of Onset  . Cirrhosis Mother 105    non alcoholic  . Cancer Father 78    bladder    Allergies  Allergen Reactions  . Varenicline Tartrate Other (See Comments)    Suicidal thoughts (Chantix)    Current Outpatient Prescriptions on File Prior  to Visit  Medication Sig Dispense Refill  . aspirin 81 MG tablet Take 81 mg by mouth daily.        . clonazePAM (KLONOPIN) 0.5 MG tablet TAKE 1 TABLET BY MOUTH EVERY OTHER DAY AS NEEDEDFOR ANXIETY  20 tablet  3  . fluticasone (FLONASE) 50 MCG/ACT nasal spray Place 1 spray into the nose daily as needed.      . hydrochlorothiazide (,MICROZIDE/HYDRODIURIL,) 12.5 MG capsule Take 12.5 mg by mouth daily.        Marland Kitchen HYDROcodone-acetaminophen (VICODIN) 5-500 MG per tablet Take 1 tablet by mouth every 4 (four) hours as needed for pain.  20 tablet  0  . ibuprofen (ADVIL,MOTRIN) 200 MG tablet Take 400 mg by mouth every 6 (six) hours as needed. For pain      . metoprolol (LOPRESSOR) 50 MG tablet TAKE 1 TABLET BY MOUTH TWICE A DAY  180 tablet  0  . potassium citrate (UROCIT-K) 10 MEQ (1080 MG) SR tablet Take 10 mEq by mouth 3 (three) times daily with meals.        BP 100/72  Pulse 95  Temp 98.3 F (36.8 C) (Oral)  Wt 146 lb (66.225 kg)  SpO2 96%chart     Objective:   Physical Exam  Constitutional: She is oriented to person, place, and time. She appears well-developed and well-nourished.  HENT:  Right Ear: External ear normal.  Left Ear: External ear normal.  Nose: Nose normal.  Mouth/Throat: Oropharynx is clear and moist.       Sinus tenderness to palpation of the maxillary sinuses bilateral. Nasal turbinates are red and boggy  Neck: Normal range of motion. Neck supple.  Cardiovascular: Normal rate, regular rhythm and normal heart sounds.   Pulmonary/Chest: Effort normal and breath sounds normal.  Neurological: She is alert and oriented to person, place, and time.  Skin: Skin is warm.  Psychiatric: She has a normal mood and affect.          Assessment & Plan:  Assessment: Acute sinusitis, cough  Plan: Augmentin 875 one by mouth twice a day x10 days. Medrol Dosepak as directed. Patient call the office if symptoms worsen or persist. Recheck as scheduled and as needed.

## 2012-08-29 NOTE — Patient Instructions (Addendum)

## 2012-09-04 ENCOUNTER — Other Ambulatory Visit: Payer: Self-pay | Admitting: Internal Medicine

## 2012-09-04 DIAGNOSIS — Z1231 Encounter for screening mammogram for malignant neoplasm of breast: Secondary | ICD-10-CM

## 2012-09-13 ENCOUNTER — Other Ambulatory Visit: Payer: Self-pay | Admitting: Family Medicine

## 2012-09-15 ENCOUNTER — Encounter: Payer: Self-pay | Admitting: Family Medicine

## 2012-09-15 ENCOUNTER — Ambulatory Visit (INDEPENDENT_AMBULATORY_CARE_PROVIDER_SITE_OTHER): Payer: Self-pay | Admitting: Family Medicine

## 2012-09-15 VITALS — BP 110/70 | HR 81 | Temp 97.4°F | Wt 149.0 lb

## 2012-09-15 DIAGNOSIS — R05 Cough: Secondary | ICD-10-CM

## 2012-09-15 DIAGNOSIS — R059 Cough, unspecified: Secondary | ICD-10-CM

## 2012-09-15 MED ORDER — HYDROCODONE-HOMATROPINE 5-1.5 MG/5ML PO SYRP: 5.0000 mL | ORAL_SOLUTION | Freq: Four times a day (QID) | ORAL | Status: AC | PRN

## 2012-09-15 NOTE — Progress Notes (Signed)
  Subjective:    Patient ID: Tammy [name], female    DOB: August 26, 1961, 51 y.o.   MRN: [id number]  HPI  Acute visit. Patient seen with persistent cough.   Mostly nonproductive. Occasionally productive of clear sputum. Was seen recently and treated for possible sinusitis with Augmentin. Denies any sore throat  Patient has recently transitioned to electronic cigarettes. No fever. No dyspnea. Patient also was treated with recent Medrol. She's not any wheezing.  . Review of Systems  Constitutional: Negative for fever and chills.  HENT: Negative for congestion.   Respiratory: Positive for cough. Negative for shortness of breath and wheezing.   Cardiovascular: Negative for chest pain.  Gastrointestinal: Negative for abdominal pain.       Objective:   Physical Exam  Constitutional: She appears well-developed and well-nourished.  HENT:  Right Ear: External ear normal.  Left Ear: External ear normal.  Mouth/Throat: Oropharynx is clear and moist.  Neck: Neck supple.  Cardiovascular: Normal rate and regular rhythm.   Pulmonary/Chest: Effort normal and breath sounds normal. No respiratory distress. She has no wheezes. She has no rales.  Lymphadenopathy:    She has no cervical adenopathy.           Assessment & Plan:  Cough probably secondary to recent viral illness. Prescription for Hycodan cough syrup for nighttime use. Followup promptly for any fever or worsening symptoms

## 2012-09-15 NOTE — Patient Instructions (Addendum)
Follow up promptly for any fever or increased shortness of breath. 

## 2012-09-21 ENCOUNTER — Ambulatory Visit (HOSPITAL_COMMUNITY)
Admission: RE | Admit: 2012-09-21 | Discharge: 2012-09-21 | Disposition: A | Discharge status: Home / Self Care | Payer: Self-pay | Source: Ambulatory Visit | Attending: Internal Medicine | Admitting: Internal Medicine

## 2012-09-21 DIAGNOSIS — Z1231 Encounter for screening mammogram for malignant neoplasm of breast: Secondary | ICD-10-CM | POA: Insufficient documentation

## 2012-10-08 ENCOUNTER — Other Ambulatory Visit: Payer: Self-pay | Admitting: Internal Medicine

## 2012-11-10 ENCOUNTER — Emergency Department (HOSPITAL_BASED_OUTPATIENT_CLINIC_OR_DEPARTMENT_OTHER)
Admission: EM | Admit: 2012-11-10 | Discharge: 2012-11-10 | Disposition: A | Discharge status: Home / Self Care | Payer: Self-pay | Attending: Emergency Medicine | Admitting: Emergency Medicine

## 2012-11-10 ENCOUNTER — Encounter (HOSPITAL_BASED_OUTPATIENT_CLINIC_OR_DEPARTMENT_OTHER): Payer: Self-pay

## 2012-11-10 ENCOUNTER — Emergency Department (HOSPITAL_BASED_OUTPATIENT_CLINIC_OR_DEPARTMENT_OTHER): Payer: Self-pay

## 2012-11-10 DIAGNOSIS — Z7982 Long term (current) use of aspirin: Secondary | ICD-10-CM | POA: Insufficient documentation

## 2012-11-10 DIAGNOSIS — I10 Essential (primary) hypertension: Secondary | ICD-10-CM | POA: Insufficient documentation

## 2012-11-10 DIAGNOSIS — Z3202 Encounter for pregnancy test, result negative: Secondary | ICD-10-CM | POA: Insufficient documentation

## 2012-11-10 DIAGNOSIS — Z79899 Other long term (current) drug therapy: Secondary | ICD-10-CM | POA: Insufficient documentation

## 2012-11-10 DIAGNOSIS — R109 Unspecified abdominal pain: Secondary | ICD-10-CM | POA: Insufficient documentation

## 2012-11-10 DIAGNOSIS — Z87442 Personal history of urinary calculi: Secondary | ICD-10-CM | POA: Insufficient documentation

## 2012-11-10 DIAGNOSIS — Z87891 Personal history of nicotine dependence: Secondary | ICD-10-CM | POA: Insufficient documentation

## 2012-11-10 DIAGNOSIS — E785 Hyperlipidemia, unspecified: Secondary | ICD-10-CM | POA: Insufficient documentation

## 2012-11-10 LAB — URINALYSIS, ROUTINE W REFLEX MICROSCOPIC
Glucose, UA: NEGATIVE mg/dL
Hgb urine dipstick: NEGATIVE
Specific Gravity, Urine: 1.007 (ref 1.005–1.030)
Urobilinogen, UA: 0.2 mg/dL (ref 0.0–1.0)
pH: 6.5 (ref 5.0–8.0)

## 2012-11-10 LAB — BASIC METABOLIC PANEL
CO2: 28 mEq/L (ref 19–32)
Chloride: 102 mEq/L (ref 96–112)
Creatinine, Ser: 0.8 mg/dL (ref 0.50–1.10)
GFR calc Af Amer: >90 mL/min (ref >90)
Potassium: 3.9 mEq/L (ref 3.5–5.1)
Sodium: 141 mEq/L (ref 135–145)

## 2012-11-10 LAB — URINE MICROSCOPIC-ADD ON

## 2012-11-10 LAB — PREGNANCY, URINE: Preg Test, Ur: NEGATIVE

## 2012-11-10 MED ORDER — SODIUM CHLORIDE 0.9 % IV BOLUS (SEPSIS)
1000.0000 mL | Freq: Once | INTRAVENOUS | Status: AC
  Administered 2012-11-10: 1000 mL via INTRAVENOUS

## 2012-11-10 MED ORDER — HYDROCODONE-ACETAMINOPHEN 5-325 MG PO TABS: 2.0000 | ORAL_TABLET | ORAL | Status: DC | PRN

## 2012-11-10 MED ORDER — KETOROLAC TROMETHAMINE 30 MG/ML IJ SOLN
INTRAMUSCULAR | Status: AC
  Administered 2012-11-10: 30 mg
  Filled 2012-11-10: qty 1

## 2012-11-10 MED ORDER — MORPHINE SULFATE 4 MG/ML IJ SOLN
4.0000 mg | Freq: Once | INTRAMUSCULAR | Status: AC
  Administered 2012-11-10: 4 mg via INTRAVENOUS
  Filled 2012-11-10: qty 1

## 2012-11-10 MED ORDER — ONDANSETRON HCL 4 MG/2ML IJ SOLN
4.0000 mg | Freq: Once | INTRAMUSCULAR | Status: AC
  Administered 2012-11-10: 4 mg via INTRAVENOUS
  Filled 2012-11-10: qty 2

## 2012-11-10 NOTE — ED Notes (Signed)
C/o kidney stone last pain last week-pain to right flank and right abd

## 2012-11-10 NOTE — ED Notes (Signed)
Pt reports passing kidney stones last week and developed right flank pain, urinary symptoms and pain radiating to RLQ and groin the past 3 days.

## 2012-11-10 NOTE — ED Provider Notes (Signed)
History     CSN: [id number]  Arrival date & time 11/10/12  1423   First MD Initiated Contact with Patient 11/10/12 1440      Chief Complaint  Patient presents with  . Nephrolithiasis    (Consider location/radiation/quality/duration/timing/severity/associated sxs/prior treatment) HPI Comments: Pt states that she had had right flank pain with radiation into the abdomen:pt states that she has passed 3 stones in the last week but she is continuing to have symptoms:pt states that her last ct was in July:pt states that she is not having any dysuria:pt denies fever, n/v/d:pt states that she is followed by a group in [place] because she has had to have surgery  The history is provided by the patient. No language interpreter was used.    Past Medical History  Diagnosis Date  . HYPERLIPIDEMIA 02/22/2008  . HYPERTENSION 02/22/2008  . Kidney stones     Past Surgical History  Procedure Date  . Tubal ligation 2006  . Induced abortion age 13  . Kidney stone surgery 2011     [facility], right percutaneous  . Endometrial ablation     Family History  Problem Relation Age of Onset  . Cirrhosis Mother 80    non alcoholic  . Cancer Father 32    bladder    History  Substance Use Topics  . Smoking status: Former Smoker -- 1.0 packs/day for 30 years  . Smokeless tobacco: Not on file  . Alcohol Use: No    OB History    Grav Para Term Preterm Abortions TAB SAB Ect Mult Living                  Review of Systems  Constitutional: Negative.   Respiratory: Negative.   Cardiovascular: Negative.     Allergies  Varenicline tartrate  Home Medications   Current Outpatient Rx  Name  Route  Sig  Dispense  Refill  . POTASSIUM CITRATE ER 10 MEQ (1080 MG) PO TBCR   Oral   Take 10 mEq by mouth 3 (three) times daily with meals.         . ASPIRIN 81 MG PO TABS   Oral   Take 81 mg by mouth daily.           Marland Kitchen CLONAZEPAM 0.5 MG PO TABS      TAKE 1 TABLET BY MOUTH EVERY OTHER DAY  AS NEEDEDFOR ANXIETY   20 tablet   3   . FLUTICASONE PROPIONATE 50 MCG/ACT NA SUSP   Nasal   Place 1 spray into the nose daily as needed.         Marland Kitchen HYDROCHLOROTHIAZIDE 12.5 MG PO CAPS   Oral   Take 12.5 mg by mouth daily. Pt taking every other day.         Marland Kitchen HYDROCODONE-ACETAMINOPHEN 5-500 MG PO TABS   Oral   Take 1 tablet by mouth every 4 (four) hours as needed for pain.   20 tablet   0     NEEDS OV   . IBUPROFEN 200 MG PO TABS   Oral   Take 400 mg by mouth every 6 (six) hours as needed. For pain         . METOPROLOL TARTRATE 50 MG PO TABS      TAKE 1 TABLET BY MOUTH TWICE A DAY   180 tablet   0     THANK YOU     BP 135/65  Pulse 82  Temp 98.1 F (36.7 C) (Oral)  Resp 16  Ht 5\' 2"  (1.575 m)  Wt 150 lb (68.04 kg)  BMI 27.44 kg/m2  SpO2 99%  Physical Exam  Nursing note and vitals reviewed. Constitutional: She is oriented to person, place, and time. She appears well-developed and well-nourished.  HENT:  Head: Normocephalic and atraumatic.  Cardiovascular: Normal rate and regular rhythm.   Pulmonary/Chest: Effort normal and breath sounds normal.  Abdominal: Soft. Bowel sounds are normal. There is no tenderness.  Musculoskeletal: Normal range of motion.  Neurological: She is alert and oriented to person, place, and time.  Skin: Skin is warm and dry.    ED Course  Procedures (including critical care time)  Labs Reviewed  URINALYSIS, ROUTINE W REFLEX MICROSCOPIC - Abnormal; Notable for the following:    Leukocytes, UA SMALL (*)     All other components within normal limits  BASIC METABOLIC PANEL - Abnormal; Notable for the following:    GFR calc non Af Amer 85 (*)     All other components within normal limits  URINE MICROSCOPIC-ADD ON - Abnormal; Notable for the following:    Bacteria, UA FEW (*)     All other components within normal limits  PREGNANCY, URINE   Dg Abd 1 View  11/10/2012  *RADIOLOGY REPORT*  Clinical Data: Nephrolithiasis   ABDOMEN - 1 VIEW  Comparison: Feb 18, 2010  Findings:  There is moderate stool in the colon.  The bowel gas pattern is normal.  No obstruction or free air is seen on this supine examination.  No abnormal calcifications are identified on this study.  IMPRESSION: Abnormal calcifications.  Nonspecific gas pattern.   Original Report Authenticated By: Bretta Bang, M.D.      1. Flank pain       MDM  No stone stone on x-ray or previous ct:pt in no acute distress:will treat with something for pain        Teressa Lower, NP 11/10/12 1703

## 2012-11-11 NOTE — ED Provider Notes (Signed)
Medical screening examination/treatment/procedure(s) were performed by non-physician practitioner and as supervising physician I was immediately available for consultation/collaboration.   Chana Lindstrom R Zareth Rippetoe, MD 11/11/12 0658 

## 2012-12-08 ENCOUNTER — Telehealth (HOSPITAL_COMMUNITY): Payer: Self-pay | Admitting: Emergency Medicine

## 2012-12-25 ENCOUNTER — Other Ambulatory Visit: Payer: Self-pay | Admitting: *Deleted

## 2012-12-25 MED ORDER — HYDROCHLOROTHIAZIDE 12.5 MG PO CAPS: 12.5000 mg | ORAL_CAPSULE | Freq: Every day | ORAL | Status: DC

## 2013-01-09 ENCOUNTER — Other Ambulatory Visit: Payer: Self-pay | Admitting: Internal Medicine

## 2013-01-24 ENCOUNTER — Encounter: Payer: Self-pay | Admitting: Family

## 2013-01-24 ENCOUNTER — Ambulatory Visit (INDEPENDENT_AMBULATORY_CARE_PROVIDER_SITE_OTHER): Payer: Self-pay | Admitting: Family

## 2013-01-24 ENCOUNTER — Telehealth: Payer: Self-pay | Admitting: Internal Medicine

## 2013-01-24 VITALS — BP 102/60 | HR 73 | Wt 152.0 lb

## 2013-01-24 DIAGNOSIS — K1370 Unspecified lesions of oral mucosa: Secondary | ICD-10-CM

## 2013-01-24 DIAGNOSIS — K121 Other forms of stomatitis: Secondary | ICD-10-CM

## 2013-01-24 DIAGNOSIS — B009 Herpesviral infection, unspecified: Secondary | ICD-10-CM

## 2013-01-24 MED ORDER — VALACYCLOVIR HCL 1 G PO TABS: 1000.0000 mg | ORAL_TABLET | Freq: Two times a day (BID) | ORAL | Status: DC

## 2013-01-24 MED ORDER — ACYCLOVIR 400 MG PO TABS: 400.0000 mg | ORAL_TABLET | Freq: Two times a day (BID) | ORAL | Status: DC

## 2013-01-24 MED ORDER — MAGIC MOUTHWASH W/LIDOCAINE: 5.0000 mL | Freq: Four times a day (QID) | ORAL | Status: DC | PRN

## 2013-01-24 NOTE — Progress Notes (Signed)
Subjective:    Patient ID: Tammy [name], female    DOB: Jan 11, 1961, 52 y.o.   MRN: [id number]  HPI 52 year old white female, smoker, patient of Dr. Cato Mulligan is in today with complaints of an outbreak of herpes simplex . Reports tender swollen lips, ulcerations inside of her lip and cheeks x2 days. Describes the pain as burning. Hasn't taken hydrocodone with no relief. She reports have been an outbreak of this approximately 20 years ago occurring very similarly. Does report being more stressed over the last couple weeks that she has been in the past. Also has noted congestion. Denies fever   Review of Systems  Constitutional: Negative.   HENT: Positive for mouth sores.        Painful lips and swelling.   Respiratory: Negative.   Cardiovascular: Negative.   Skin: Negative.   Allergic/Immunologic: Negative.   Neurological: Negative.   Psychiatric/Behavioral: Negative.    Past Medical History  Diagnosis Date  . HYPERLIPIDEMIA 02/22/2008  . HYPERTENSION 02/22/2008  . Kidney stones     History   Social History  . Marital Status: Divorced    Spouse Name: N/A    Number of Children: N/A  . Years of Education: N/A   Occupational History  . Not on file.   Social History Main Topics  . Smoking status: Former Smoker -- 1.00 packs/day for 30 years  . Smokeless tobacco: Not on file  . Alcohol Use: No  . Drug Use: No  . Sexually Active: Not on file   Other Topics Concern  . Not on file   Social History Narrative  . No narrative on file    Past Surgical History  Procedure Laterality Date  . Tubal ligation  2006  . Induced abortion  age 89  . Kidney stone surgery  2011     [facility] Urology, right percutaneous  . Endometrial ablation      Family History  Problem Relation Age of Onset  . Cirrhosis Mother 51    non alcoholic  . Cancer Father 61    bladder    Allergies  Allergen Reactions  . Varenicline Tartrate Other (See Comments)    Suicidal thoughts (Chantix)     Current Outpatient Prescriptions on File Prior to Visit  Medication Sig Dispense Refill  . aspirin 81 MG tablet Take 81 mg by mouth daily.        . clonazePAM (KLONOPIN) 0.5 MG tablet TAKE 1 TABLET BY MOUTH EVERY OTHER DAY AS NEEDEDFOR ANXIETY  20 tablet  3  . fluticasone (FLONASE) 50 MCG/ACT nasal spray Place 1 spray into the nose daily as needed.      . hydrochlorothiazide (MICROZIDE) 12.5 MG capsule Take 1 capsule (12.5 mg total) by mouth daily.  30 capsule  5  . HYDROcodone-acetaminophen (NORCO/VICODIN) 5-325 MG per tablet Take 2 tablets by mouth every 4 (four) hours as needed for pain.  10 tablet  0  . ibuprofen (ADVIL,MOTRIN) 200 MG tablet Take 400 mg by mouth every 6 (six) hours as needed. For pain      . metoprolol (LOPRESSOR) 50 MG tablet TAKE 1 TABLET BY MOUTH TWICE A DAY  180 tablet  0  . potassium citrate (UROCIT-K) 10 MEQ (1080 MG) SR tablet Take 10 mEq by mouth 3 (three) times daily with meals.       No current facility-administered medications on file prior to visit.    BP 102/60  Pulse 73  Wt 152 lb (68.947 kg)  BMI 27.79 kg/m2  SpO2 98%chart    Objective:   Physical Exam  Constitutional: She is oriented to person, place, and time. She appears well-developed and well-nourished.  HENT:  Right Ear: External ear normal.  Left Ear: External ear normal.  Nose: Nose normal.  Multiple ulcerations noted to the buccal mucosa. lower the red, swollen, with ulcerations  noted. No active drainage or discharge.   Neck: Normal range of motion. Neck supple.  Cardiovascular: Normal rate, regular rhythm and normal heart sounds.   Pulmonary/Chest: Effort normal and breath sounds normal.  Lymphadenopathy:    She has cervical adenopathy.  Neurological: She is alert and oriented to person, place, and time.  Skin: Skin is warm and dry.  Psychiatric: She has a normal mood and affect.          Assessment & Plan:   Assessment: 1. Herpes simplex 1 2. Tobacco abuse  Plan:  Valtrex 1 g twice a day. Avoid possible exposure to others. Medical mouthwash gargle, swish, and swallow as needed. Call the office if symptoms worsen or persist. Recheck a schedule, and as needed.

## 2013-01-24 NOTE — Patient Instructions (Addendum)
Cold Sores (Herpes Simplex Virus) The herpes simplex virus causes fever blisters or cold sores. Fever blisters are small sores on the lips, gums or roof of the mouth. People commonly get infected with this herpes virus but do not have any symptoms. The blisters may break out when a person:  Is especially tired.  Has a fever.  Is menstruating (in women  having a monthly period).  Under emotional stress.  Has another infection (such as a cold).  Is exposed to sunlight. Pain, tingling, itch, burning may occur before the blisters in the affected area. The blisters usually heal within a week. The virus can be easily passed to other people and to other parts of the body such as the eyes and sex organs. HOME CARE INSTRUCTIONS   Only take over-the-counter or prescription medicines for pain, discomfort, or fever as directed by your caregiver. Do not use aspirin.  Do not touch the blisters or pick the scabs. Wash your hands often and do not touch your eyes without washing your hands first.  To help reduce discomfort, apply an ice cube or ice pack to your lip for 30 minutes or suck on frozen juice bars.  Use a sun screen on your lips when you go outdoors.  This infection is contagious. Avoid close contact with other people, especially kissing or oral sex, until blisters heal. The virus that causes cold sores usually is different than the one that causes sores on the genitals. However, cold sores may occur in persons who have oral sex with a partner who has genital herpes.  Hot, cold, or salty foods may hurt your mouth. Use a straw to drink. Eating a well-balanced diet will help healing. SEEK IMMEDIATE MEDICAL CARE IF:   Your eye feels irritated, painful, or you feel like you have something in your eye.  You develop a fever, feel achy, or see pus instead of clear fluid in the sores. These are signs of a bacterial (germ) infection. You can apply over-the-counter neomycin ointment.  You get  blisters on your genitals.  You develop new unexplained symptoms. MAKE SURE YOU:   Understand these instructions.  Will watch your condition.  Will get help right away if you are not doing well or get worse. Document Released: 10/01/2000 Document Revised: 12/27/2011 Document Reviewed: 02/16/2012 [facility] Patient Information 2013 [place], [place].  Valtrex 2 tabs twice a day x 24 hours.

## 2013-01-24 NOTE — Telephone Encounter (Signed)
Pt was seen in the office today by PA Orvan Falconer and prescribed Valtrex.  Pharmacist states pt does not have insurance and medication is very expensive.  Pharmacist wants to know if medication can be changed to Acyclovir.  OFFICE PLEASE FOLLOW UP WITH THE PHARMACY.

## 2013-01-26 ENCOUNTER — Encounter: Payer: Self-pay | Admitting: Family

## 2013-04-11 ENCOUNTER — Other Ambulatory Visit: Payer: Self-pay | Admitting: Internal Medicine

## 2013-04-25 ENCOUNTER — Other Ambulatory Visit: Payer: Self-pay | Admitting: Family

## 2013-06-15 ENCOUNTER — Other Ambulatory Visit: Payer: Self-pay | Admitting: Internal Medicine

## 2013-07-02 ENCOUNTER — Encounter: Payer: Self-pay | Admitting: Family Medicine

## 2013-07-02 ENCOUNTER — Ambulatory Visit (INDEPENDENT_AMBULATORY_CARE_PROVIDER_SITE_OTHER): Payer: Self-pay | Admitting: Family Medicine

## 2013-07-02 VITALS — BP 110/80 | Temp 99.1°F | Wt 150.0 lb

## 2013-07-02 DIAGNOSIS — Z23 Encounter for immunization: Secondary | ICD-10-CM

## 2013-07-02 DIAGNOSIS — M79671 Pain in right foot: Secondary | ICD-10-CM

## 2013-07-02 DIAGNOSIS — J069 Acute upper respiratory infection, unspecified: Secondary | ICD-10-CM

## 2013-07-02 DIAGNOSIS — M79609 Pain in unspecified limb: Secondary | ICD-10-CM

## 2013-07-02 MED ORDER — HYDROCODONE-HOMATROPINE 5-1.5 MG/5ML PO SYRP: 5.0000 mL | ORAL_SOLUTION | Freq: Three times a day (TID) | ORAL | Status: DC | PRN

## 2013-07-02 NOTE — Patient Instructions (Addendum)
INSTRUCTIONS FOR UPPER RESPIRATORY INFECTION:  -plenty of rest and fluids  -nasal saline wash 2-3 times daily (use prepackaged nasal saline or bottled/distilled water if making your own)   -can use afrin nasal spray for drainage and nasal congestion - but do NOT use longer then 3-4 days  -can use tylenol or ibuprofen as directed for aches and sorethroat  -in the winter time, using a humidifier at night is helpful (please follow cleaning instructions)  -if you are taking a cough medication - use only as directed, may also try a teaspoon of honey to coat the throat and throat lozenges  -for sore throat, salt water gargles can help  -follow up if you have fevers, facial pain, tooth pain, difficulty breathing or are worsening or not getting better in 5-7 days  FOR FOOT PAIN: -use good supportive foot wear with wide base -use STRASSBURG SOCK at night -follow up with your doctor in 2 months if persists

## 2013-07-02 NOTE — Progress Notes (Signed)
Chief Complaint  Patient presents with  . feet pain  . Cough    HPI:   Tammy [name] is a 52 yo F pt of Dr. Cato Mulligan here for an acute visit for:  1) feet pain: -started 2 months ago -both feet - in arch area and heal -pain is bad in heals first thing in the morning -used to go barefoot a lot - improved with shoes, but now worse -denies: weakness, numbness, fevers, malaise, bowel or bladder incontinence  2) cough: -started 3 days ago symptoms: nasal congestion, cough, drainage in throat, sore throat, sneezing, does have some sinus pressure -has tried flonase -denies: fevers, chills, strep throat exposures, NVD, SOB -got cough med in past that helped   ROS: See pertinent positives and negatives per HPI.  Past Medical History  Diagnosis Date  . HYPERLIPIDEMIA 02/22/2008  . HYPERTENSION 02/22/2008  . Kidney stones     Past Surgical History  Procedure Laterality Date  . Tubal ligation  2006  . Induced abortion  age 67  . Kidney stone surgery  2011     [facility], right percutaneous  . Endometrial ablation      Family History  Problem Relation Age of Onset  . Cirrhosis Mother 2    non alcoholic  . Cancer Father 58    bladder    History   Social History  . Marital Status: Divorced    Spouse Name: N/A    Number of Children: N/A  . Years of Education: N/A   Social History Main Topics  . Smoking status: Former Smoker -- 1.00 packs/day for 30 years  . Smokeless tobacco: None  . Alcohol Use: No  . Drug Use: No  . Sexual Activity: None   Other Topics Concern  . None   Social History Narrative  . None    Current outpatient prescriptions:acyclovir (ZOVIRAX) 400 MG tablet, TAKE 1 TABLET (400 MG TOTAL) BY MOUTH 2 (TWO) TIMES DAILY., Disp: 20 tablet, Rfl: 0;  Alum & Mag Hydroxide-Simeth (MAGIC MOUTHWASH W/LIDOCAINE) SOLN, Take 5 mLs by mouth 4 (four) times daily as needed., Disp: 100 mL, Rfl: 0;  aspirin 81 MG tablet, Take 81 mg by mouth daily.  , Disp:  , Rfl:  clonazePAM (KLONOPIN) 0.5 MG tablet, TAKE 1 TABLET BY MOUTH EVERY OTHER DAY AS NEEDEDFOR ANXIETY, Disp: 20 tablet, Rfl: 3;  fluticasone (FLONASE) 50 MCG/ACT nasal spray, Place 1 spray into the nose daily as needed., Disp: , Rfl: ;  hydrochlorothiazide (MICROZIDE) 12.5 MG capsule, TAKE ONE CAPSULE BY MOUTH DAILY, Disp: 30 capsule, Rfl: 5 ibuprofen (ADVIL,MOTRIN) 200 MG tablet, Take 400 mg by mouth every 6 (six) hours as needed. For pain, Disp: , Rfl: ;  metoprolol (LOPRESSOR) 50 MG tablet, TAKE 1 TABLET BY MOUTH TWICE A DAY, Disp: 180 tablet, Rfl: 1;  potassium citrate (UROCIT-K) 10 MEQ (1080 MG) SR tablet, Take 10 mEq by mouth 3 (three) times daily with meals., Disp: , Rfl:  HYDROcodone-acetaminophen (NORCO/VICODIN) 5-325 MG per tablet, Take 2 tablets by mouth every 4 (four) hours as needed for pain., Disp: 10 tablet, Rfl: 0;  HYDROcodone-homatropine (HYCODAN) 5-1.5 MG/5ML syrup, Take 5 mLs by mouth every 8 (eight) hours as needed for cough., Disp: 120 mL, Rfl: 0  EXAM:  Filed Vitals:   07/02/13 1010  BP: 110/80  Temp: 99.1 F (37.3 C)    Body mass index is 27.43 kg/(m^2).  GENERAL: vitals reviewed and listed above, alert, oriented, appears well hydrated and in no  acute distress  HEENT: atraumatic, conjunttiva clear, no obvious abnormalities on inspection of external nose and ears, normal appearance of ear canals and TMs, clear nasal congestion, mild post oropharyngeal erythema with PND, no tonsillar edema or exudate, no sinus TTP  NECK: no obvious masses on inspection  LUNGS: clear to auscultation bilaterally, no wheezes, rales or rhonchi, good air movement  CV: HRRR, no peripheral edema  MS: moves all extremities without noticeable abnormality -normal gait -mild pesplanus bilat, TTP plantar fascia bilat and a little dorsal ant foot TTP on R -no bony TTP, NV intact, neg talar tilt, neg tinel, neg ant/pst drawer, no swelling or other deformity noted  PSYCH: pleasant and  cooperative, no obvious depression or anxiety  ASSESSMENT AND PLAN:  Discussed the following assessment and plan:  Upper respiratory infection - Plan: HYDROcodone-homatropine (HYCODAN) 5-1.5 MG/5ML syrup -suspect viral -advised supportive care, cough med, return precautions Recommendations per orders and instructions, risks and use of medications and return precautions discussed.  Foot pain, bilateral -suspect due to foot wear and mild plantar fasciitis -[place] sock, good footwear, tylenol and topical sports creams, follow up with PCP if persists  Flu vaccine given  -Patient advised to return or notify a doctor immediately if symptoms worsen or persist or new concerns arise.  Patient Instructions  INSTRUCTIONS FOR UPPER RESPIRATORY INFECTION:  -plenty of rest and fluids  -nasal saline wash 2-3 times daily (use prepackaged nasal saline or bottled/distilled water if making your own)   -can use afrin nasal spray for drainage and nasal congestion - but do NOT use longer then 3-4 days  -can use tylenol or ibuprofen as directed for aches and sorethroat  -in the winter time, using a humidifier at night is helpful (please follow cleaning instructions)  -if you are taking a cough medication - use only as directed, may also try a teaspoon of honey to coat the throat and throat lozenges  -for sore throat, salt water gargles can help  -follow up if you have fevers, facial pain, tooth pain, difficulty breathing or are worsening or not getting better in 5-7 days  FOR FOOT PAIN: -use good supportive foot wear with wide base -use STRASSBURG SOCK at night -follow up with your doctor in 2 months if persists      Maricela Schreur R.

## 2013-07-12 ENCOUNTER — Other Ambulatory Visit: Payer: Self-pay | Admitting: Family Medicine

## 2013-07-12 NOTE — Telephone Encounter (Signed)
Ok to refill once, but if worsening symptoms or still feeling bad needs to be seen.

## 2013-07-13 NOTE — Telephone Encounter (Signed)
Pt is calling to inquire about this refill. Please assist.

## 2013-07-13 NOTE — Telephone Encounter (Signed)
Rx sent to pharmacy. Pt is aware.  

## 2013-08-15 ENCOUNTER — Ambulatory Visit: Payer: Self-pay | Admitting: Internal Medicine

## 2013-09-14 ENCOUNTER — Encounter: Payer: Self-pay | Admitting: Internal Medicine

## 2013-09-14 ENCOUNTER — Ambulatory Visit (INDEPENDENT_AMBULATORY_CARE_PROVIDER_SITE_OTHER): Payer: Self-pay | Admitting: Internal Medicine

## 2013-09-14 VITALS — BP 126/80 | HR 70 | Temp 98.2°F | Resp 20 | Wt 146.0 lb

## 2013-09-14 DIAGNOSIS — F411 Generalized anxiety disorder: Secondary | ICD-10-CM

## 2013-09-14 DIAGNOSIS — F17200 Nicotine dependence, unspecified, uncomplicated: Secondary | ICD-10-CM

## 2013-09-14 DIAGNOSIS — R739 Hyperglycemia, unspecified: Secondary | ICD-10-CM

## 2013-09-14 DIAGNOSIS — R7309 Other abnormal glucose: Secondary | ICD-10-CM

## 2013-09-14 DIAGNOSIS — I10 Essential (primary) hypertension: Secondary | ICD-10-CM

## 2013-09-14 DIAGNOSIS — Z0000 Encounter for general adult medical examination without abnormal findings: Secondary | ICD-10-CM

## 2013-09-14 DIAGNOSIS — E785 Hyperlipidemia, unspecified: Secondary | ICD-10-CM

## 2013-09-14 LAB — BASIC METABOLIC PANEL
BUN: 18 mg/dL (ref 6–23)
Calcium: 10.1 mg/dL (ref 8.4–10.5)
Creatinine, Ser: 0.8 mg/dL (ref 0.4–1.2)
GFR: 77.15 mL/min (ref >60.00)
Glucose, Bld: 99 mg/dL (ref 70–99)

## 2013-09-14 LAB — LDL CHOLESTEROL, DIRECT: Direct LDL: 186.6 mg/dL

## 2013-09-14 LAB — HEMOGLOBIN A1C: Hgb A1c MFr Bld: 5.7 % (ref 4.6–6.5)

## 2013-09-14 MED ORDER — CLONAZEPAM 0.5 MG PO TABS: ORAL_TABLET | ORAL | Status: DC

## 2013-09-14 MED ORDER — FLUTICASONE PROPIONATE 50 MCG/ACT NA SUSP: 1.0000 | Freq: Every day | NASAL | Status: AC | PRN

## 2013-09-14 NOTE — Progress Notes (Signed)
Pre-visit discussion using our clinic review tool. No additional management support is needed unless otherwise documented below in the visit note.  

## 2013-09-14 NOTE — Assessment & Plan Note (Signed)
Quit smoking. 

## 2013-09-14 NOTE — Assessment & Plan Note (Signed)
Ok to use klonopin prn

## 2013-09-14 NOTE — Progress Notes (Signed)
htn- needs f/u- tolerating meds, home bps 110/60  Anxiety- tolerating meds  Gestational DM-"long time ago". She checks CBGs once in a while-. Glucose 100-110.   Reviewed pmh, meds, sochx  Ros- has had back pain- between shoulders for years. Trigger finger- right thumb No other complaints  Exam- BP 126/80  Pulse 70  Temp(Src) 98.2 F (36.8 C)  Resp 20  Wt 146 lb (66.225 kg)  SpO2 98%  Well-developed well-nourished female in no acute distress. HEENT exam atraumatic, normocephalic, extraocular muscles are intact. Neck is supple. No jugular venous distention no thyromegaly. Chest clear to auscultation without increased work of breathing. Cardiac exam S1 and S2 are regular. Abdominal exam active bowel sounds, soft, nontender. Extremities no edema. Neurologic exam she is alert without any motor sensory deficits. Gait is normal.

## 2013-09-14 NOTE — Assessment & Plan Note (Signed)
Lipid Panel     Component Value Date/Time   CHOL 249* 07/17/2012 0854   TRIG 261.0* 07/17/2012 0854   HDL 35.50* 07/17/2012 0854   CHOLHDL 7 07/17/2012 0854   VLDL 52.2* 07/17/2012 0854   LDLCALC 137* 08/03/2007 1957   Check today

## 2013-09-14 NOTE — Assessment & Plan Note (Signed)
Well controlled continue meds 

## 2013-09-18 ENCOUNTER — Other Ambulatory Visit: Payer: Self-pay | Admitting: *Deleted

## 2013-09-18 MED ORDER — ATORVASTATIN CALCIUM 20 MG PO TABS: 20.0000 mg | ORAL_TABLET | Freq: Every day | ORAL | Status: DC

## 2013-10-02 ENCOUNTER — Other Ambulatory Visit: Payer: Self-pay | Admitting: Internal Medicine

## 2013-10-15 ENCOUNTER — Encounter: Payer: Self-pay | Admitting: Internal Medicine

## 2013-10-15 DIAGNOSIS — M6530 Trigger finger, unspecified finger: Secondary | ICD-10-CM

## 2013-10-15 DIAGNOSIS — Z0000 Encounter for general adult medical examination without abnormal findings: Secondary | ICD-10-CM

## 2013-11-09 ENCOUNTER — Telehealth: Payer: Self-pay | Admitting: Internal Medicine

## 2013-11-09 NOTE — Telephone Encounter (Signed)
Pt requesting to speak Tammy [name] a referral appt, states she faxed insurance on fri 11/02/13

## 2013-11-13 ENCOUNTER — Encounter: Payer: Self-pay | Admitting: Internal Medicine

## 2013-12-07 ENCOUNTER — Other Ambulatory Visit: Payer: Self-pay | Admitting: Internal Medicine

## 2013-12-20 ENCOUNTER — Ambulatory Visit (AMBULATORY_SURGERY_CENTER): Payer: Self-pay

## 2013-12-20 VITALS — Ht 61.0 in | Wt 151.0 lb

## 2013-12-20 DIAGNOSIS — Z1211 Encounter for screening for malignant neoplasm of colon: Secondary | ICD-10-CM

## 2013-12-20 MED ORDER — MOVIPREP 100 G PO SOLR: 1.0000 | Freq: Once | ORAL | Status: DC

## 2013-12-24 ENCOUNTER — Encounter: Payer: Self-pay | Admitting: Internal Medicine

## 2013-12-24 ENCOUNTER — Telehealth: Payer: Self-pay | Admitting: Internal Medicine

## 2013-12-24 NOTE — Telephone Encounter (Signed)
Free coupon given to patient, patient instructed to come to 4 th floor desk to pick up by Thursday. She understands.

## 2013-12-28 ENCOUNTER — Telehealth: Payer: Self-pay | Admitting: Internal Medicine

## 2013-12-28 ENCOUNTER — Ambulatory Visit (AMBULATORY_SURGERY_CENTER): Payer: 59 | Admitting: Internal Medicine

## 2013-12-28 ENCOUNTER — Encounter: Payer: Self-pay | Admitting: Internal Medicine

## 2013-12-28 VITALS — BP 118/62 | HR 71 | Temp 97.0°F | Resp 19 | Ht 61.0 in | Wt 151.0 lb

## 2013-12-28 DIAGNOSIS — D126 Benign neoplasm of colon, unspecified: Secondary | ICD-10-CM

## 2013-12-28 DIAGNOSIS — Z1211 Encounter for screening for malignant neoplasm of colon: Secondary | ICD-10-CM

## 2013-12-28 MED ORDER — SODIUM CHLORIDE 0.9 % IV SOLN: 500.0000 mL | INTRAVENOUS | Status: DC

## 2013-12-28 NOTE — Progress Notes (Signed)
Called to room to assist during endoscopic procedure.  Patient ID and intended procedure confirmed with present staff. Received instructions for my participation in the procedure from the performing physician.  

## 2013-12-28 NOTE — Telephone Encounter (Signed)
[facility] needs referral for pt.  DX codes   592.0     791.9      275.40      592.0 md is Bethann Goo Pt has appt:  01/02/14 Pt is a current pt   [facility] insurance fax [phone]

## 2013-12-28 NOTE — Op Note (Signed)
[place]  [address]. [place], [address]   COLONOSCOPY PROCEDURE REPORT  PATIENT: Tammy [name], Tammy [name]  MR#: [id number] BIRTHDATE: 08/06/1961 , 52  yrs. old GENDER: Female ENDOSCOPIST: Jerene Bears, MD REFERRED ID:POEUM Swords, M.D. PROCEDURE DATE:  12/28/2013 PROCEDURE:   Colonoscopy with snare polypectomy First Screening Colonoscopy - Avg.  risk and is 50 yrs.  old or older Yes.  Prior Negative Screening - Now for repeat screening. N/A  History of Adenoma - Now for follow-up colonoscopy & has been > or = to 3 yrs.  N/A  Polyps Removed Today? Yes. ASA CLASS:   Class II INDICATIONS:average risk screening and first colonoscopy. MEDICATIONS: MAC sedation, administered by CRNA and propofol (Diprivan) 250mg  IV  DESCRIPTION OF PROCEDURE:   After the risks benefits and alternatives of the procedure were thoroughly explained, informed consent was obtained.  A digital rectal exam revealed no rectal mass.   The LB [id number]  endoscope was introduced through the anus and advanced to the cecum, which was identified by both the appendix and ileocecal valve. No adverse events experienced. The quality of the prep was good, using MoviPrep  The instrument was then slowly withdrawn as the colon was fully examined.   COLON FINDINGS: Four sessile polyps measuring 4-10 mm in size were found in the ascending colon (3) and transverse colon (1). Polypectomy was performed using cold snare.  All resections were complete and all polyp tissue was completely retrieved.   There was moderate diverticulosis noted in the descending colon and sigmoid colon with associated muscular hypertrophy.  Retroflexed views revealed external hemorrhoids. The time to cecum=2 minutes 07 seconds.  Withdrawal time=17 minutes 36 seconds.  The scope was withdrawn and the procedure completed. COMPLICATIONS: There were no complications.  ENDOSCOPIC IMPRESSION: 1.   Four sessile polyps measuring 4-10  mm in size were found in the ascending colon and transverse colon; Polypectomy was performed using cold snare 2.   There was moderate diverticulosis noted in the descending colon and sigmoid colon  RECOMMENDATIONS: 1.  Await pathology results 2.  Hold aspirin, aspirin products, and anti-inflammatory medication for 1 week. 3.  High fiber diet 4.  If the polyps removed today are proven to be adenomatous (pre-cancerous) polyps, you will need a colonoscopy in 3 years. Otherwise you should continue to follow colorectal cancer screening guidelines for "routine risk" patients with a colonoscopy in 10 years.  You will receive a letter within 1-2 weeks with the results of your biopsy as well as final recommendations.  Please call my office if you have not received a letter after 3 weeks.   eSigned:  Jerene Bears, MD 12/28/2013 9:46 AM   cc: The Patient and Lisabeth Pick, MD   PATIENT NAME:  Malary, A[place]worth MR#: [id number]

## 2013-12-28 NOTE — Patient Instructions (Signed)
YOU HAD AN ENDOSCOPIC PROCEDURE TODAY AT THE [facility]: Refer to the procedure report that was given to you for any specific questions about what was found during the examination.  If the procedure report does not answer your questions, please call your gastroenterologist to clarify.  If you requested that your care partner not be given the details of your procedure findings, then the procedure report has been included in a sealed envelope for you to review at your convenience later.  YOU SHOULD EXPECT: Some feelings of bloating in the abdomen. Passage of more gas than usual.  Walking can help get rid of the air that was put into your GI tract during the procedure and reduce the bloating. If you had a lower endoscopy (such as a colonoscopy or flexible sigmoidoscopy) you may notice spotting of blood in your stool or on the toilet paper. If you underwent a bowel prep for your procedure, then you may not have a normal bowel movement for a few days.  DIET: Your first meal following the procedure should be a light meal and then it is ok to progress to your normal diet.  A half-sandwich or bowl of soup is an example of a good first meal.  Heavy or fried foods are harder to digest and may make you feel nauseous or bloated.  Likewise meals heavy in dairy and vegetables can cause extra gas to form and this can also increase the bloating.  Drink plenty of fluids but you should avoid alcoholic beverages for 24 hours.  ACTIVITY: Your care partner should take you home directly after the procedure.  You should plan to take it easy, moving slowly for the rest of the day.  You can resume normal activity the day after the procedure however you should NOT DRIVE or use heavy machinery for 24 hours (because of the sedation medicines used during the test).    SYMPTOMS TO REPORT IMMEDIATELY: A gastroenterologist can be reached at any hour.  During normal business hours, 8:30 AM to 5:00 PM Monday through Friday,  call [phone].  After hours and on weekends, please call the GI answering service at [phone] who will take a message and have the physician on call contact you.   Following lower endoscopy (colonoscopy or flexible sigmoidoscopy):  Excessive amounts of blood in the stool  Significant tenderness or worsening of abdominal pains  Swelling of the abdomen that is new, acute  Fever of 100F or higher    FOLLOW UP: If any biopsies were taken you will be contacted by phone or by letter within the next 1-3 weeks.  Call your gastroenterologist if you have not heard about the biopsies in 3 weeks.  Our staff will call the home number listed on your records the next business day following your procedure to check on you and address any questions or concerns that you may have at that time regarding the information given to you following your procedure. This is a courtesy call and so if there is no answer at the home number and we have not heard from you through the emergency physician on call, we will assume that you have returned to your regular daily activities without incident.  SIGNATURES/CONFIDENTIALITY: You and/or your care partner have signed paperwork which will be entered into your electronic medical record.  These signatures attest to the fact that that the information above on your After Visit Summary has been reviewed and is understood.  Full responsibility of the confidentiality   of this discharge information lies with you and/or your care-partner.   Information on polyps and diverticulosis ,and high fiber diet given to you today   HOLD ASPIRIN AND ANTI INFLAMMATORY PRODUCTS FOR ONE WEEK

## 2013-12-31 ENCOUNTER — Telehealth: Payer: Self-pay | Admitting: *Deleted

## 2013-12-31 NOTE — Telephone Encounter (Signed)
  Follow up Call-  Call back number 12/28/2013  Post procedure Call Back phone  # [phone]  Permission to leave phone message Yes     Patient questions:  Do you have a fever, pain , or abdominal swelling? no Pain Score  0 *  Have you tolerated food without any problems? yes  Have you been able to return to your normal activities? yes  Do you have any questions about your discharge instructions: Diet   no Medications  no Follow up visit  no  Do you have questions or concerns about your Care? no  Actions: * If pain score is 4 or above: No action needed, pain <4.

## 2014-01-02 ENCOUNTER — Encounter: Payer: Self-pay | Admitting: Internal Medicine

## 2014-01-02 NOTE — Telephone Encounter (Signed)
Referral submitted; [id number].

## 2014-01-11 ENCOUNTER — Telehealth: Payer: Self-pay | Admitting: Internal Medicine

## 2014-01-14 NOTE — Telephone Encounter (Signed)
I have reviewed the results with the patient and all questions answered

## 2014-01-23 ENCOUNTER — Ambulatory Visit: Payer: 59 | Admitting: Internal Medicine

## 2014-02-19 ENCOUNTER — Ambulatory Visit: Payer: 59 | Admitting: Internal Medicine

## 2014-02-21 ENCOUNTER — Ambulatory Visit: Payer: 59 | Admitting: Family Medicine

## 2014-03-06 ENCOUNTER — Other Ambulatory Visit: Payer: Self-pay | Admitting: Internal Medicine

## 2014-03-13 ENCOUNTER — Ambulatory Visit (INDEPENDENT_AMBULATORY_CARE_PROVIDER_SITE_OTHER): Payer: 59 | Admitting: Family Medicine

## 2014-03-13 ENCOUNTER — Telehealth: Payer: Self-pay | Admitting: Internal Medicine

## 2014-03-13 ENCOUNTER — Encounter: Payer: Self-pay | Admitting: Family Medicine

## 2014-03-13 VITALS — BP 130/70 | HR 66 | Temp 98.7°F | Wt 156.0 lb

## 2014-03-13 DIAGNOSIS — E785 Hyperlipidemia, unspecified: Secondary | ICD-10-CM

## 2014-03-13 DIAGNOSIS — R05 Cough: Secondary | ICD-10-CM

## 2014-03-13 DIAGNOSIS — R059 Cough, unspecified: Secondary | ICD-10-CM

## 2014-03-13 DIAGNOSIS — I10 Essential (primary) hypertension: Secondary | ICD-10-CM

## 2014-03-13 LAB — HEPATIC FUNCTION PANEL
ALBUMIN: 3.9 g/dL (ref 3.5–5.2)
ALT: 32 U/L (ref 0–35)
AST: 25 U/L (ref 0–37)
Alkaline Phosphatase: 67 U/L (ref 39–117)
Bilirubin, Direct: 0 mg/dL (ref 0.0–0.3)
Total Bilirubin: 0.6 mg/dL (ref 0.2–1.2)
Total Protein: 6.7 g/dL (ref 6.0–8.3)

## 2014-03-13 LAB — LIPID PANEL
CHOL/HDL RATIO: 4
Cholesterol: 140 mg/dL (ref 0–200)
HDL: 38.7 mg/dL — AB (ref >39.00)
LDL CALC: 58 mg/dL (ref 0–99)
TRIGLYCERIDES: 219 mg/dL — AB (ref 0.0–149.0)
VLDL: 43.8 mg/dL — AB (ref 0.0–40.0)

## 2014-03-13 MED ORDER — HYDROCODONE-HOMATROPINE 5-1.5 MG/5ML PO SYRP: 5.0000 mL | ORAL_SOLUTION | Freq: Four times a day (QID) | ORAL | Status: AC | PRN

## 2014-03-13 NOTE — Telephone Encounter (Signed)
Relevant patient education assigned to patient using Emmi. ° °

## 2014-03-13 NOTE — Progress Notes (Signed)
   Subjective:    Patient ID: Tammy [name], female    DOB: [birth year], 53 y.o.   MRN: [id number]  Cough Associated symptoms include postnasal drip. Pertinent negatives include no chest pain, headaches, sore throat, shortness of breath or wheezing.   Here for medical followup. She has hyperlipidemia and started Lipitor last fall. She is tolerating without side effects. No history of CAD or peripheral vascular disease. Still smoking but only about one pack electronic cigarettes per week. She's had cough for the past couple weeks. Nonproductive. Occasional postnasal drip. No fevers or chills. No dyspnea. No hemoptysis. No recent appetite or weight changes.  Hypertension treated with metoprolol and HCTZ. Blood pressure stable. No recent dizziness. Strong family history of type 2 diabetes. Recent A1c 5.7%. Compliant with all meds.  Past Medical History  Diagnosis Date  . HYPERLIPIDEMIA 02/22/2008  . HYPERTENSION 02/22/2008  . Kidney stones    Past Surgical History  Procedure Laterality Date  . Tubal ligation  2006  . Induced abortion  age 88  . Kidney stone surgery  2011     [facility], right percutaneous  . Endometrial ablation    . Lap      reports that she has quit smoking. She uses smokeless tobacco. She reports that she does not drink alcohol or use illicit drugs. family history includes Cancer (age of onset: 51) in her father; Cirrhosis (age of onset: 4) in her mother. There is no history of Colon cancer, Pancreatic cancer, or Stomach cancer. Allergies  Allergen Reactions  . Varenicline Tartrate Other (See Comments)    Suicidal thoughts (Chantix)      Review of Systems  Constitutional: Negative for fatigue.  HENT: Positive for congestion and postnasal drip. Negative for sore throat.   Eyes: Negative for visual disturbance.  Respiratory: Positive for cough. Negative for chest tightness, shortness of breath and wheezing.   Cardiovascular: Negative for chest pain,  palpitations and leg swelling.  Neurological: Negative for dizziness, seizures, syncope, weakness, light-headedness and headaches.       Objective:   Physical Exam  Constitutional: She appears well-developed and well-nourished.  Neck: Neck supple.  Cardiovascular: Normal rate and regular rhythm.  Exam reveals no gallop.   No murmur heard. Pulmonary/Chest: Effort normal and breath sounds normal. No respiratory distress. She has no wheezes. She has no rales.  Musculoskeletal: She exhibits no edema.  Lymphadenopathy:    She has no cervical adenopathy.          Assessment & Plan:  #1 hyperlipidemia. Repeat lipid and hepatic panel. Continue atorvastatin #2 hypertension. Adequate control. Continue current medications #3 cough. Suspect allergic postnasal drip versus acute viral bronchitis. Continue antihistamine as needed. 1 refill Hycodan cough syrup 1 teaspoon each bedtime when necessary

## 2014-03-13 NOTE — Progress Notes (Signed)
Pre visit review using our clinic review tool, if applicable. No additional management support is needed unless otherwise documented below in the visit note. 

## 2014-03-14 ENCOUNTER — Encounter: Payer: Self-pay | Admitting: Family Medicine

## 2014-03-27 ENCOUNTER — Ambulatory Visit (INDEPENDENT_AMBULATORY_CARE_PROVIDER_SITE_OTHER): Payer: 59 | Admitting: Family Medicine

## 2014-03-27 ENCOUNTER — Encounter: Payer: Self-pay | Admitting: Family Medicine

## 2014-03-27 VITALS — BP 128/74 | HR 69 | Wt 156.0 lb

## 2014-03-27 DIAGNOSIS — M25529 Pain in unspecified elbow: Secondary | ICD-10-CM

## 2014-03-27 DIAGNOSIS — M7711 Lateral epicondylitis, right elbow: Secondary | ICD-10-CM

## 2014-03-27 DIAGNOSIS — M7710 Lateral epicondylitis, unspecified elbow: Secondary | ICD-10-CM

## 2014-03-27 MED ORDER — METHYLPREDNISOLONE ACETATE 40 MG/ML IJ SUSP
40.0000 mg | Freq: Once | INTRAMUSCULAR | Status: AC
  Administered 2014-03-27: 40 mg via INTRA_ARTICULAR

## 2014-03-27 NOTE — Patient Instructions (Signed)
Lateral Epicondylitis (Tennis Elbow) with Rehab Lateral epicondylitis involves inflammation and pain around the outer portion of the elbow. The pain is caused by inflammation of the tendons in the forearm that bring back (extend) the wrist. Lateral epicondylittis is also called tennis elbow, because it is very common in tennis players. However, it may occur in any individual who extends the wrist repetitively. If lateral epicondylitis is left untreated, it may become a chronic problem. SYMPTOMS   Pain, tenderness, and inflammation on the outer (lateral) side of the elbow.  Pain or weakness with gripping activities.  Pain that increases with wrist twisting motions (playing tennis, using a screwdriver, opening a door or a jar).  Pain with lifting objects, including a coffee cup. CAUSES  Lateral epicondylitis is caused by inflammation of the tendons that extend the wrist. Causes of injury may include:  Repetitive stress and strain on the muscles and tendons that extend the wrist.  Sudden change in activity level or intensity.  Incorrect grip in racquet sports.  Incorrect grip size of racquet (often too large).  Incorrect hitting position or technique (usually backhand, leading with the elbow).  Using a racket that is too heavy. RISK INCREASES WITH:  Sports or occupations that require repetitive and/or strenuous forearm and wrist movements (tennis, squash, racquetball, carpentry).  Poor wrist and forearm strength and flexibility.  Failure to warm up properly before activity.  Resuming activity before healing, rehabilitation, and conditioning are complete. PREVENTION   Warm up and stretch properly before activity.  Maintain physical fitness:  Strength, flexibility, and endurance.  Cardiovascular fitness.  Wear and use properly fitted equipment.  Learn and use proper technique and have a coach correct improper technique.  Wear a tennis elbow (counterforce) brace. PROGNOSIS   The course of this condition depends on the degree of the injury. If treated properly, acute cases (symptoms lasting less than 4 weeks) are often resolved in 2 to 6 weeks. Chronic (longer lasting cases) often resolve in 3 to 6 months, but may require physical therapy. RELATED COMPLICATIONS   Frequently recurring symptoms, resulting in a chronic problem. Properly treating the problem the first time decreases frequency of recurrence.  Chronic inflammation, scarring tendon degeneration, and partial tendon tear, requiring surgery.  Delayed healing or resolution of symptoms. TREATMENT  Treatment first involves the use of ice and medicine, to reduce pain and inflammation. Strengthening and stretching exercises may help reduce discomfort, if performed regularly. These exercises may be performed at home, if the condition is an acute injury. Chronic cases may require a referral to a physical therapist for evaluation and treatment. Your caregiver may advise a corticosteroid injection, to help reduce inflammation. Rarely, surgery is needed. MEDICATION  If pain medicine is needed, nonsteroidal anti-inflammatory medicines (aspirin and ibuprofen), or other minor pain relievers (acetaminophen), are often advised.  Do not take pain medicine for 7 days before surgery.  Prescription pain relievers may be given, if your caregiver thinks they are needed. Use only as directed and only as much as you need.  Corticosteroid injections may be recommended. These injections should be reserved only for the most severe cases, because they can only be given a certain number of times. HEAT AND COLD  Cold treatment (icing) should be applied for 10 to 15 minutes every 2 to 3 hours for inflammation and pain, and immediately after activity that aggravates your symptoms. Use ice packs or an ice massage.  Heat treatment may be used before performing stretching and strengthening activities prescribed by your  caregiver, physical  therapist, or athletic trainer. Use a heat pack or a warm water soak. SEEK MEDICAL CARE IF: Symptoms get worse or do not improve in 2 weeks, despite treatment. EXERCISES  RANGE OF MOTION (ROM) AND STRETCHING EXERCISES - Epicondylitis, Lateral (Tennis Elbow) These exercises may help you when beginning to rehabilitate your injury. Your symptoms may go away with or without further involvement from your physician, physical therapist or athletic trainer. While completing these exercises, remember:   Restoring tissue flexibility helps normal motion to return to the joints. This allows healthier, less painful movement and activity.  An effective stretch should be held for at least 30 seconds.  A stretch should never be painful. You should only feel a gentle lengthening or release in the stretched tissue. RANGE OF MOTION  Wrist Flexion, Active-Assisted  Extend your right / left elbow with your fingers pointing down.*  Gently pull the back of your hand towards you, until you feel a gentle stretch on the top of your forearm.  Hold this position for __________ seconds. Repeat __________ times. Complete this exercise __________ times per day.  *If directed by your physician, physical therapist or athletic trainer, complete this stretch with your elbow bent, rather than extended. RANGE OF MOTION  Wrist Extension, Active-Assisted  Extend your right / left elbow and turn your palm upwards.*  Gently pull your palm and fingertips back, so your wrist extends and your fingers point more toward the ground.  You should feel a gentle stretch on the inside of your forearm.  Hold this position for __________ seconds. Repeat __________ times. Complete this exercise __________ times per day. *If directed by your physician, physical therapist or athletic trainer, complete this stretch with your elbow bent, rather than extended. STRETCH - Wrist Flexion  Place the back of your right / left hand on a tabletop,  leaving your elbow slightly bent. Your fingers should point away from your body.  Gently press the back of your hand down onto the table by straightening your elbow. You should feel a stretch on the top of your forearm.  Hold this position for __________ seconds. Repeat __________ times. Complete this stretch __________ times per day.  STRETCH  Wrist Extension   Place your right / left fingertips on a tabletop, leaving your elbow slightly bent. Your fingers should point backwards.  Gently press your fingers and palm down onto the table by straightening your elbow. You should feel a stretch on the inside of your forearm.  Hold this position for __________ seconds. Repeat __________ times. Complete this stretch __________ times per day.  STRENGTHENING EXERCISES - Epicondylitis, Lateral (Tennis Elbow) These exercises may help you when beginning to rehabilitate your injury. They may resolve your symptoms with or without further involvement from your physician, physical therapist or athletic trainer. While completing these exercises, remember:   Muscles can gain both the endurance and the strength needed for everyday activities through controlled exercises.  Complete these exercises as instructed by your physician, physical therapist or athletic trainer. Increase the resistance and repetitions only as guided.  You may experience muscle soreness or fatigue, but the pain or discomfort you are trying to eliminate should never worsen during these exercises. If this pain does get worse, stop and make sure you are following the directions exactly. If the pain is still present after adjustments, discontinue the exercise until you can discuss the trouble with your caregiver. STRENGTH Wrist Flexors  Sit with your right / left forearm palm-up and  fully supported on a table or countertop. Your elbow should be resting below the height of your shoulder. Allow your wrist to extend over the edge of the  surface.  Loosely holding a __________ weight, or a piece of rubber exercise band or tubing, slowly curl your hand up toward your forearm.  Hold this position for __________ seconds. Slowly lower the wrist back to the starting position in a controlled manner. Repeat __________ times. Complete this exercise __________ times per day.  STRENGTH  Wrist Extensors  Sit with your right / left forearm palm-down and fully supported on a table or countertop. Your elbow should be resting below the height of your shoulder. Allow your wrist to extend over the edge of the surface.  Loosely holding a __________ weight, or a piece of rubber exercise band or tubing, slowly curl your hand up toward your forearm.  Hold this position for __________ seconds. Slowly lower the wrist back to the starting position in a controlled manner. Repeat __________ times. Complete this exercise __________ times per day.  STRENGTH - Ulnar Deviators  Stand with a ____________________ weight in your right / left hand, or sit while holding a rubber exercise band or tubing, with your healthy arm supported on a table or countertop.  Move your wrist, so that your pinkie travels toward your forearm and your thumb moves away from your forearm.  Hold this position for __________ seconds and then slowly lower the wrist back to the starting position. Repeat __________ times. Complete this exercise __________ times per day STRENGTH - Radial Deviators  Stand with a ____________________ weight in your right / left hand, or sit while holding a rubber exercise band or tubing, with your injured arm supported on a table or countertop.  Raise your hand upward in front of you or pull up on the rubber tubing.  Hold this position for __________ seconds and then slowly lower the wrist back to the starting position. Repeat __________ times. Complete this exercise __________ times per day. STRENGTH  Forearm Supinators   Sit with your right /  left forearm supported on a table, keeping your elbow below shoulder height. Rest your hand over the edge, palm down.  Gently grip a hammer or a soup ladle.  Without moving your elbow, slowly turn your palm and hand upward to a "thumbs-up" position.  Hold this position for __________ seconds. Slowly return to the starting position. Repeat __________ times. Complete this exercise __________ times per day.  STRENGTH  Forearm Pronators   Sit with your right / left forearm supported on a table, keeping your elbow below shoulder height. Rest your hand over the edge, palm up.  Gently grip a hammer or a soup ladle.  Without moving your elbow, slowly turn your palm and hand upward to a "thumbs-up" position.  Hold this position for __________ seconds. Slowly return to the starting position. Repeat __________ times. Complete this exercise __________ times per day.  STRENGTH - Grip  Grasp a tennis ball, a dense sponge, or a large, rolled sock in your hand.  Squeeze as hard as you can, without increasing any pain.  Hold this position for __________ seconds. Release your grip slowly. Repeat __________ times. Complete this exercise __________ times per day.  STRENGTH - Elbow Extensors, Isometric  Stand or sit upright, on a firm surface. Place your right / left arm so that your palm faces your stomach, and it is at the height of your waist.  Place your opposite hand on  the underside of your forearm. Gently push up as your right / left arm resists. Push as hard as you can with both arms, without causing any pain or movement at your right / left elbow. Hold this stationary position for __________ seconds. Gradually release the tension in both arms. Allow your muscles to relax completely before repeating. Document Released: 10/04/2005 Document Revised: 12/27/2011 Document Reviewed: 01/16/2009 [facility] Patient Information 2014 [place], [place].

## 2014-03-27 NOTE — Progress Notes (Signed)
   Subjective:    Patient ID: Tammy [name], female    DOB: 1960-11-16, 53 y.o.   MRN: [id number]  HPI  Patient seen with lateral elbow. No injury. 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 She has moderate pain. Worse with gripping and lifting. No radiation of pain. No neck pain. She has not tried any icing. She is using a tennis elbow sleeve which is helping. Denies any prior history of similar problem. No upper extremity weakness  Past Medical History  Diagnosis Date  . HYPERLIPIDEMIA 02/22/2008  . HYPERTENSION 02/22/2008  . Kidney stones    Past Surgical History  Procedure Laterality Date  . Tubal ligation  2006  . Induced abortion  age 66  . Kidney stone surgery  2011     [facility], right percutaneous  . Endometrial ablation    . Lap      reports that she has quit smoking. She uses smokeless tobacco. She reports that she does not drink alcohol or use illicit drugs. family history includes Cancer (age of onset: 10) in her father; Cirrhosis (age of onset: 73) in her mother. There is no history of Colon cancer, Pancreatic cancer, or Stomach cancer. Allergies  Allergen Reactions  . Varenicline Tartrate Other (See Comments)    Suicidal thoughts (Chantix)      Review of Systems  Neurological: Negative for weakness and numbness.       Objective:   Physical Exam  Constitutional: She appears well-developed and well-nourished.  Cardiovascular: Normal rate.   Pulmonary/Chest: Effort normal and breath sounds normal. No respiratory distress. She has no wheezes. She has no rales.  Musculoskeletal:  Right elbow reveals full range of motion. No ecchymosis. No warmth. No erythema. Tenderness over the lateral epicondylar region. She has pain with wrist extension against resistance but not with wrist flexion against resistance.  Neurological:  No upper extremity weakness          Assessment & Plan:  Right lateral epicondylitis. We've recommended  icing 2-3 times daily. We discussed risk and benefits of corticosteroid including risk of bleeding, bruising, infection. Patient consented. Prepped right lateral elbow with Betadine. Using sterile technique injected 1 cc of plain Xylocaine and 1/2 cc of Depo-Medrol using 25-gauge 5/8 needle. Patient tolerated well. Stretching and strengthening exercises given. Touch base 2 weeks if no better

## 2014-03-27 NOTE — Progress Notes (Signed)
Pre visit review using our clinic review tool, if applicable. No additional management support is needed unless otherwise documented below in the visit note. 

## 2014-03-30 ENCOUNTER — Other Ambulatory Visit: Payer: Self-pay | Admitting: Internal Medicine

## 2014-04-11 ENCOUNTER — Telehealth: Payer: Self-pay | Admitting: Internal Medicine

## 2014-04-11 MED ORDER — HYDROCODONE-HOMATROPINE 5-1.5 MG/5ML PO SYRP: 5.0000 mL | ORAL_SOLUTION | Freq: Four times a day (QID) | ORAL | Status: DC | PRN

## 2014-04-11 NOTE — Telephone Encounter (Signed)
Pt informed that RX is ready for pick up 

## 2014-04-11 NOTE — Telephone Encounter (Signed)
Pt was rx HYDROcodone-homatropine (HYCODAN) 5-1.5 MG/5ML syrup By dr Elease Hashimoto on 5/27 and states she is still coughing and would like a refill of this cough med. pls advise

## 2014-04-11 NOTE — Telephone Encounter (Signed)
Refill once 

## 2014-05-07 ENCOUNTER — Telehealth: Payer: Self-pay | Admitting: Internal Medicine

## 2014-05-07 DIAGNOSIS — Z87442 Personal history of urinary calculi: Secondary | ICD-10-CM

## 2014-05-07 NOTE — Telephone Encounter (Signed)
yes

## 2014-05-07 NOTE — Telephone Encounter (Signed)
Dr Dorris Fetch, please advise if you will accept pt

## 2014-05-07 NOTE — Telephone Encounter (Signed)
Referral order placed.

## 2014-05-07 NOTE — Telephone Encounter (Addendum)
Pt would like a referral to see dr Karsten Ro [phone] for kidney stones. Pt has [facility] ins. Pt also would like dr burchette to accept her as new pt. Can I sch?

## 2014-05-14 ENCOUNTER — Telehealth: Payer: Self-pay | Admitting: Family Medicine

## 2014-05-14 NOTE — Telephone Encounter (Signed)
Pt does have appointment on Friday 7/31

## 2014-05-14 NOTE — Telephone Encounter (Signed)
Patient Information:  Caller Name: Buffy  Phone: [phone]  Patient: Tammy [name], Tammy [name]  Gender: Female  DOB: Feb 05, 1961  Age: 53 Years  PCP: Phoebe Sharps (Adults only, leaving end of July 2015)  Pregnant: No  Office Follow Up:  Does the office need to follow up with this patient?: Yes  Instructions For The Office: [place] - she has appointment scheduled for this Friday, 05/17/2014 the nursing disposition was to be seen within 24 hours.  RN Note:  Afebrile. 04/20/2014 was in [place] and got bug bite, lower tail bone "at crack' and left shoulder that are itchy, decreasing in size but were about the size of a quarter and red and now feels tired contantly. She also got a cough/congestion at that time. She also has had some infrequent sweats. She states the bug bites, did have some pus and her boyfriend 'Squeezed it out" and now, almost gone but concerned about the bites and new fatigue, nauseated and did have syncopal episode 05/10/2014. According to [facility] guideline:  All emergent signs and symptoms of Fatigue protocol ruled out except 'Persistent fatigue that came on suddenly without know cause. Home Care advice given. She does have an appointment scheduled for this Firday, 05/17/2014 and due to her in house day care, could not do the recommended 24 hour disposition.    .  Symptoms  Reason For Call & Symptoms: Bug bite in [place] on top of the buttock, arm and cough/congestion  Reviewed Health History In EMR: Yes  Reviewed Medications In EMR: Yes  Reviewed Allergies In EMR: Yes  Reviewed Surgeries / Procedures: Yes  Date of Onset of Symptoms: 04/20/2014 OB / GYN:  LMP: Unknown  Guideline(s) Used:  No Protocol Available - Sick Adult  Disposition Per Guideline:   See Today or Tomorrow in Office  Reason For Disposition Reached:   Nursing judgment  Advice Given:  N/A  Patient Refused Recommendation:  Patient Will Follow Up With Office Later  She agreed to go to the ER if any syncope or  situation worsens, but keeping her Friday appointment due to no coverage for her in house Day Care.

## 2014-05-15 ENCOUNTER — Encounter: Payer: Self-pay | Admitting: Family Medicine

## 2014-05-15 ENCOUNTER — Ambulatory Visit (INDEPENDENT_AMBULATORY_CARE_PROVIDER_SITE_OTHER): Payer: 59 | Admitting: Family Medicine

## 2014-05-15 ENCOUNTER — Other Ambulatory Visit: Payer: Self-pay | Admitting: Family Medicine

## 2014-05-15 VITALS — BP 120/80 | HR 79 | Temp 98.6°F | Wt 155.0 lb

## 2014-05-15 DIAGNOSIS — Z1231 Encounter for screening mammogram for malignant neoplasm of breast: Secondary | ICD-10-CM

## 2014-05-15 DIAGNOSIS — R21 Rash and other nonspecific skin eruption: Secondary | ICD-10-CM

## 2014-05-15 NOTE — Patient Instructions (Signed)
Schedule complete physical for this Fall.

## 2014-05-15 NOTE — Progress Notes (Signed)
Pre visit review using our clinic review tool, if applicable. No additional management support is needed unless otherwise documented below in the visit note. 

## 2014-05-15 NOTE — Telephone Encounter (Signed)
Pt came into the office to be seen today 05/15/14.

## 2014-05-15 NOTE — Progress Notes (Signed)
   Subjective:    Patient ID: Tammy [name], female    DOB: [birth year], 53 y.o.   MRN: [id number]  HPI Patient here with multiple complaints. Last week she had what she thought were couple of insect bites including left arm and mid lower back. These have reduced in swelling substantially since then. She had some mild itching but no pain. No fevers or chills. She has hot flashes 10-12 times per day. She's had these off and on for a couple of years. She had previous endometrial ablation years ago. She also complains of some generalized fatigue. No recent weight changes  Past Medical History  Diagnosis Date  . HYPERLIPIDEMIA 02/22/2008  . HYPERTENSION 02/22/2008  . Kidney stones    Past Surgical History  Procedure Laterality Date  . Tubal ligation  2006  . Induced abortion  age 57  . Kidney stone surgery  2011     [facility], right percutaneous  . Endometrial ablation    . Lap      reports that she has quit smoking. She uses smokeless tobacco. She reports that she does not drink alcohol or use illicit drugs. family history includes Cancer (age of onset: 54) in her father; Cirrhosis (age of onset: 72) in her mother. There is no history of Colon cancer, Pancreatic cancer, or Stomach cancer. Allergies  Allergen Reactions  . Varenicline Tartrate Other (See Comments)    Suicidal thoughts (Chantix)      Review of Systems  Constitutional: Positive for fatigue. Negative for unexpected weight change.  Eyes: Negative for visual disturbance.  Respiratory: Negative for cough, chest tightness, shortness of breath and wheezing.   Cardiovascular: Negative for chest pain, palpitations and leg swelling.  Neurological: Negative for dizziness, seizures, syncope, weakness, light-headedness and headaches.       Objective:   Physical Exam  Constitutional: She appears well-developed and well-nourished.  Neck: Neck supple. No thyromegaly present.  Cardiovascular: Normal rate and regular rhythm.     Pulmonary/Chest: Effort normal and breath sounds normal. No respiratory distress. She has no wheezes. She has no rales.  Musculoskeletal: She exhibits no edema.  Skin:  Resolving mild erythema left post arm.  Slightly dry and scaly.  No pustule or vesicle.          Assessment & Plan:  Recent skin rash. Resolving. Possibly related to some type of bite. No signs of secondary infection. Reassurance given  Postmenopausal hot flashes. Patient will get repeat mammogram. We discussed briefly possible supplement with estrogen-progesterone supplement.  Get mammogram first.

## 2014-05-17 ENCOUNTER — Ambulatory Visit: Payer: 59 | Admitting: Family Medicine

## 2014-05-17 ENCOUNTER — Ambulatory Visit (HOSPITAL_COMMUNITY)
Admission: RE | Admit: 2014-05-17 | Discharge: 2014-05-17 | Disposition: A | Discharge status: Home / Self Care | Payer: 59 | Source: Ambulatory Visit | Attending: Family Medicine | Admitting: Family Medicine

## 2014-05-17 DIAGNOSIS — Z1231 Encounter for screening mammogram for malignant neoplasm of breast: Secondary | ICD-10-CM | POA: Diagnosis not present

## 2014-06-06 ENCOUNTER — Other Ambulatory Visit: Payer: Self-pay | Admitting: Internal Medicine

## 2014-07-11 ENCOUNTER — Encounter: Payer: Self-pay | Admitting: Family Medicine

## 2014-07-11 ENCOUNTER — Ambulatory Visit (INDEPENDENT_AMBULATORY_CARE_PROVIDER_SITE_OTHER): Payer: 59 | Admitting: Family Medicine

## 2014-07-11 ENCOUNTER — Other Ambulatory Visit (HOSPITAL_COMMUNITY)
Admission: RE | Admit: 2014-07-11 | Discharge: 2014-07-11 | Disposition: A | Discharge status: Home / Self Care | Payer: 59 | Source: Ambulatory Visit | Attending: Family Medicine | Admitting: Family Medicine

## 2014-07-11 VITALS — BP 118/80 | HR 74 | Temp 98.0°F | Ht 61.0 in | Wt 153.0 lb

## 2014-07-11 DIAGNOSIS — Z23 Encounter for immunization: Secondary | ICD-10-CM

## 2014-07-11 DIAGNOSIS — Z01419 Encounter for gynecological examination (general) (routine) without abnormal findings: Secondary | ICD-10-CM | POA: Insufficient documentation

## 2014-07-11 DIAGNOSIS — Z0000 Encounter for general adult medical examination without abnormal findings: Secondary | ICD-10-CM

## 2014-07-11 DIAGNOSIS — R7309 Other abnormal glucose: Secondary | ICD-10-CM

## 2014-07-11 DIAGNOSIS — R7303 Prediabetes: Secondary | ICD-10-CM | POA: Insufficient documentation

## 2014-07-11 LAB — LIPID PANEL
Cholesterol: 149 mg/dL (ref 0–200)
HDL: 36.3 mg/dL — ABNORMAL LOW (ref >39.00)
NonHDL: 112.7
TRIGLYCERIDES: 204 mg/dL — AB (ref 0.0–149.0)
Total CHOL/HDL Ratio: 4
VLDL: 40.8 mg/dL — ABNORMAL HIGH (ref 0.0–40.0)

## 2014-07-11 LAB — CBC WITH DIFFERENTIAL/PLATELET
BASOS PCT: 0.5 % (ref 0.0–3.0)
Basophils Absolute: 0 10*3/uL (ref 0.0–0.1)
EOS PCT: 2.8 % (ref 0.0–5.0)
Eosinophils Absolute: 0.2 10*3/uL (ref 0.0–0.7)
HEMATOCRIT: 44.1 % (ref 36.0–46.0)
HEMOGLOBIN: 15.1 g/dL — AB (ref 12.0–15.0)
LYMPHS ABS: 2.5 10*3/uL (ref 0.7–4.0)
Lymphocytes Relative: 28.7 % (ref 12.0–46.0)
MCHC: 34.3 g/dL (ref 30.0–36.0)
MCV: 98.8 fl (ref 78.0–100.0)
Monocytes Absolute: 0.5 10*3/uL (ref 0.1–1.0)
Monocytes Relative: 5.3 % (ref 3.0–12.0)
Neutro Abs: 5.4 10*3/uL (ref 1.4–7.7)
Neutrophils Relative %: 62.7 % (ref 43.0–77.0)
Platelets: 237 10*3/uL (ref 150.0–400.0)
RBC: 4.46 Mil/uL (ref 3.87–5.11)
RDW: 12.7 % (ref 11.5–15.5)
WBC: 8.6 10*3/uL (ref 4.0–10.5)

## 2014-07-11 LAB — LDL CHOLESTEROL, DIRECT: Direct LDL: 81.6 mg/dL

## 2014-07-11 LAB — BASIC METABOLIC PANEL
BUN: 13 mg/dL (ref 6–23)
CO2: 31 meq/L (ref 19–32)
CREATININE: 0.9 mg/dL (ref 0.4–1.2)
Calcium: 10 mg/dL (ref 8.4–10.5)
Chloride: 104 mEq/L (ref 96–112)
GFR: 72.83 mL/min (ref >60.00)
Glucose, Bld: 98 mg/dL (ref 70–99)
Potassium: 4.2 mEq/L (ref 3.5–5.1)
Sodium: 141 mEq/L (ref 135–145)

## 2014-07-11 LAB — HEPATIC FUNCTION PANEL
ALBUMIN: 4.4 g/dL (ref 3.5–5.2)
ALT: 33 U/L (ref 0–35)
AST: 27 U/L (ref 0–37)
Alkaline Phosphatase: 81 U/L (ref 39–117)
BILIRUBIN DIRECT: 0.1 mg/dL (ref 0.0–0.3)
Total Bilirubin: 0.6 mg/dL (ref 0.2–1.2)
Total Protein: 7.6 g/dL (ref 6.0–8.3)

## 2014-07-11 LAB — TSH: TSH: 1.25 u[IU]/mL (ref 0.35–4.50)

## 2014-07-11 LAB — HEMOGLOBIN A1C: Hgb A1c MFr Bld: 5.8 % (ref 4.6–6.5)

## 2014-07-11 NOTE — Progress Notes (Signed)
Pre visit review using our clinic review tool, if applicable. No additional management support is needed unless otherwise documented below in the visit note. 

## 2014-07-11 NOTE — Patient Instructions (Signed)
Continue weight loss efforts. We will call you regarding results of blood work and Pap smear obtained today

## 2014-07-11 NOTE — Progress Notes (Signed)
   Subjective:    Patient ID: Tammy [name], female    DOB: Sep 05, 1961, 53 y.o.   MRN: [id number]  HPI   Patient here for complete physical. She's not had Pap smear in over 3 years. Recent mammogram normal.  She had colonoscopy earlier this year. Needs flu vaccine. Tetanus up-to-date. Her chronic problems include history of previous smoking, hypertension, dyslipidemia, recurrent kidney stones. Medications reviewed. Compliant with all.  Past Medical History  Diagnosis Date  . HYPERLIPIDEMIA 02/22/2008  . HYPERTENSION 02/22/2008  . Kidney stones    Past Surgical History  Procedure Laterality Date  . Tubal ligation  2006  . Induced abortion  age 51  . Kidney stone surgery  2011     [facility], right percutaneous  . Endometrial ablation    . Lap      reports that she has quit smoking. She uses smokeless tobacco. She reports that she does not drink alcohol or use illicit drugs. family history includes Cancer (age of onset: 28) in her father; Cirrhosis (age of onset: 35) in her mother. There is no history of Colon cancer, Pancreatic cancer, or Stomach cancer. Allergies  Allergen Reactions  . Varenicline Tartrate Other (See Comments)    Suicidal thoughts (Chantix)       Review of Systems  Constitutional: Negative for fever, activity change, appetite change, fatigue and unexpected weight change.  HENT: Negative for ear pain, hearing loss, sore throat and trouble swallowing.   Eyes: Negative for visual disturbance.  Respiratory: Negative for cough and shortness of breath.   Cardiovascular: Negative for chest pain and palpitations.  Gastrointestinal: Negative for abdominal pain, diarrhea, constipation and blood in stool.  Endocrine: Negative for polydipsia and polyuria.  Genitourinary: Negative for dysuria and hematuria.  Musculoskeletal: Negative for arthralgias, back pain and myalgias.  Skin: Negative for rash.  Neurological: Negative for dizziness, syncope and headaches.  Weakness: .pch.  Hematological: Negative for adenopathy.  Psychiatric/Behavioral: Negative for confusion and dysphoric mood.       Objective:   Physical Exam  Constitutional: She is oriented to person, place, and time. She appears well-developed and well-nourished.  HENT:  Head: Normocephalic and atraumatic.  Eyes: EOM are normal. Pupils are equal, round, and reactive to light.  Neck: Normal range of motion. Neck supple. No thyromegaly present.  Cardiovascular: Normal rate, regular rhythm and normal heart sounds.   No murmur heard. Pulmonary/Chest: Breath sounds normal. No respiratory distress. She has no wheezes. She has no rales.  Abdominal: Soft. Bowel sounds are normal. She exhibits no distension and no mass. There is no tenderness. There is no rebound and no guarding.  Genitourinary: Vagina normal and uterus normal. No vaginal discharge found.  Breasts symmetric with no mass. No skin dimpling or nipple inversion.  Cervix normal in appearance.  Pap obtained with cytobroom and cytobrush.  Musculoskeletal: Normal range of motion. She exhibits no edema.  Lymphadenopathy:    She has no cervical adenopathy.  Neurological: She is alert and oriented to person, place, and time. She displays normal reflexes. No cranial nerve deficit.  Skin: No rash noted.  Psychiatric: She has a normal mood and affect. Her behavior is normal. Judgment and thought content normal.          Assessment & Plan:  Health maintenance. Obtain Pap smear. Obtain screening lab work. Flu vaccine given. Other preventative health issues recently addressed as above.

## 2014-07-15 ENCOUNTER — Encounter: Payer: Self-pay | Admitting: Family Medicine

## 2014-07-15 LAB — CYTOLOGY - PAP

## 2014-08-27 ENCOUNTER — Telehealth: Payer: Self-pay | Admitting: Family Medicine

## 2014-08-27 NOTE — Telephone Encounter (Signed)
[id number] - [place], [place] - [address] is requesting re-fill on potassium citrate (UROCIT-K) 10 MEQ (1080 MG) SR tablet

## 2014-08-28 MED ORDER — POTASSIUM CITRATE ER 10 MEQ (1080 MG) PO TBCR: 10.0000 meq | EXTENDED_RELEASE_TABLET | Freq: Three times a day (TID) | ORAL | Status: DC

## 2014-08-28 NOTE — Telephone Encounter (Signed)
RX sent to pharmacy  

## 2014-08-29 ENCOUNTER — Other Ambulatory Visit: Payer: Self-pay | Admitting: Family Medicine

## 2014-08-29 NOTE — Telephone Encounter (Signed)
Refill once 

## 2014-08-29 NOTE — Telephone Encounter (Signed)
Pt has a cough and would like another rx hydrocodone cough syrup. Pt was offer an appt

## 2014-08-30 MED ORDER — HYDROCODONE-HOMATROPINE 5-1.5 MG/5ML PO SYRP: 5.0000 mL | ORAL_SOLUTION | Freq: Three times a day (TID) | ORAL | Status: DC | PRN

## 2014-08-30 NOTE — Telephone Encounter (Signed)
Rx ready for pick up and patient is aware 

## 2014-09-02 ENCOUNTER — Other Ambulatory Visit: Payer: Self-pay | Admitting: Internal Medicine

## 2014-09-25 ENCOUNTER — Ambulatory Visit (INDEPENDENT_AMBULATORY_CARE_PROVIDER_SITE_OTHER): Payer: 59 | Admitting: Family Medicine

## 2014-09-25 ENCOUNTER — Encounter: Payer: Self-pay | Admitting: Family Medicine

## 2014-09-25 ENCOUNTER — Other Ambulatory Visit: Payer: Self-pay | Admitting: Internal Medicine

## 2014-09-25 VITALS — BP 116/70 | HR 87 | Temp 98.4°F | Wt 154.0 lb

## 2014-09-25 DIAGNOSIS — R05 Cough: Secondary | ICD-10-CM

## 2014-09-25 DIAGNOSIS — R0781 Pleurodynia: Secondary | ICD-10-CM

## 2014-09-25 DIAGNOSIS — R109 Unspecified abdominal pain: Secondary | ICD-10-CM

## 2014-09-25 DIAGNOSIS — R053 Chronic cough: Secondary | ICD-10-CM

## 2014-09-25 MED ORDER — CLONAZEPAM 0.5 MG PO TABS: ORAL_TABLET | ORAL | Status: DC

## 2014-09-25 MED ORDER — METHOCARBAMOL 500 MG PO TABS: 500.0000 mg | ORAL_TABLET | Freq: Three times a day (TID) | ORAL | Status: DC | PRN

## 2014-09-25 NOTE — Progress Notes (Signed)
Pre visit review using our clinic review tool, if applicable. No additional management support is needed unless otherwise documented below in the visit note. 

## 2014-09-25 NOTE — Progress Notes (Signed)
   Subjective:    Patient ID: Tammy [name], female    DOB: Jul 04, 1961, 53 y.o.   MRN: [id number]  HPI Patient seen for acute visit with the following issues  She complains of some left rib cage pain for about the past week. 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 Denies specific injury. She has had a cough now for about 3 months which is mostly dry occasionally productive of clear sputum. She smokes only rarely now but has smoked for several years. Denies any appetite or weight changes. No pleuritic pain. No hemoptysis. She's not tried anything for pain and terms of medications other than her husband had a Robaxin which seemed to help. She's also had some mid thoracic back pains intermittently which are worse with lifting and movement. No chest pains  Patient also requesting refill Klonopin. She uses only very rarely for severe anxiety.  Regarding her chronic cough she has not had any consistent obvious GERD symptoms. No wheezing. No history of asthma. Occasional postnasal drip symptoms. Denies chronic sinusitis symptoms  Past Medical History  Diagnosis Date  . HYPERLIPIDEMIA 02/22/2008  . HYPERTENSION 02/22/2008  . Kidney stones    Past Surgical History  Procedure Laterality Date  . Tubal ligation  2006  . Induced abortion  age 55  . Kidney stone surgery  2011     [facility] Urology, right percutaneous  . Endometrial ablation    . Lap      reports that she has quit smoking. She uses smokeless tobacco. She reports that she does not drink alcohol or use illicit drugs. family history includes Cancer (age of onset: 40) in her father; Cirrhosis (age of onset: 62) in her mother. There is no history of Colon cancer, Pancreatic cancer, or Stomach cancer. Allergies  Allergen Reactions  . Varenicline Tartrate Other (See Comments)    Suicidal thoughts (Chantix)      Review of Systems  Constitutional: Negative for fever, chills, appetite change and unexpected weight change.  Respiratory: Positive for  cough. Negative for shortness of breath and wheezing.   Cardiovascular: Negative for chest pain, palpitations and leg swelling.  Gastrointestinal: Negative for abdominal pain.  Genitourinary: Negative for dysuria.  Musculoskeletal: Positive for back pain.  Neurological: Negative for dizziness.  Hematological: Negative for adenopathy.       Objective:   Physical Exam  Constitutional: She appears well-developed and well-nourished.  Neck: Neck supple.  Cardiovascular: Normal rate and regular rhythm.   Pulmonary/Chest: Effort normal and breath sounds normal. No respiratory distress. She has no wheezes. She has no rales.  Musculoskeletal: She exhibits no edema.  No spinal tenderness. She has some mild tenderness left posterior rib cage area around T7 to T 8.  Skin: No rash noted.          Assessment & Plan:  #1 chronic cough. No red flags such as weight loss, dyspnea, hemoptysis.  Given duration needs chest x-ray for further evaluation and this was ordered. We discussed other possible issues such as GERD. Recommend elevate head of bed 6-8 inches. Consider spirometry or further lung function testing. #2 left posterior rib cage pain for 1 week duration. Question related to chronic cough versus lifting. Set up chest x-ray  as above. #3 left thoracic back pain. Has responded Robaxin that she took from her husband's. We provided prescription for Robaxin 500 mg 1 every 8 hours as needed for muscle spasm. She'll try heat and/or ice.

## 2014-09-26 ENCOUNTER — Telehealth: Payer: Self-pay

## 2014-09-26 ENCOUNTER — Ambulatory Visit (INDEPENDENT_AMBULATORY_CARE_PROVIDER_SITE_OTHER)
Admission: RE | Admit: 2014-09-26 | Discharge: 2014-09-26 | Disposition: A | Discharge status: Home / Self Care | Payer: 59 | Source: Ambulatory Visit | Attending: Family Medicine | Admitting: Family Medicine

## 2014-09-26 DIAGNOSIS — R05 Cough: Secondary | ICD-10-CM

## 2014-09-26 DIAGNOSIS — R053 Chronic cough: Secondary | ICD-10-CM

## 2014-09-26 DIAGNOSIS — R109 Unspecified abdominal pain: Secondary | ICD-10-CM

## 2014-09-26 NOTE — Telephone Encounter (Signed)
Pt wants to know what she should do next. Is there any medication she can get?

## 2014-09-27 NOTE — Telephone Encounter (Signed)
I would add Aleve one to two twice daily (which she can take in addition to the Robaxin)

## 2014-09-27 NOTE — Telephone Encounter (Signed)
Pt informed

## 2014-10-13 ENCOUNTER — Encounter (HOSPITAL_COMMUNITY): Payer: Self-pay | Admitting: *Deleted

## 2014-10-13 ENCOUNTER — Emergency Department (HOSPITAL_COMMUNITY): Payer: 59

## 2014-10-13 ENCOUNTER — Emergency Department (HOSPITAL_COMMUNITY)
Admission: EM | Admit: 2014-10-13 | Discharge: 2014-10-13 | Disposition: A | Discharge status: Home / Self Care | Payer: 59 | Attending: Emergency Medicine | Admitting: Emergency Medicine

## 2014-10-13 DIAGNOSIS — R109 Unspecified abdominal pain: Secondary | ICD-10-CM | POA: Insufficient documentation

## 2014-10-13 DIAGNOSIS — Z87442 Personal history of urinary calculi: Secondary | ICD-10-CM | POA: Diagnosis not present

## 2014-10-13 DIAGNOSIS — Z79899 Other long term (current) drug therapy: Secondary | ICD-10-CM | POA: Insufficient documentation

## 2014-10-13 DIAGNOSIS — Z7982 Long term (current) use of aspirin: Secondary | ICD-10-CM | POA: Insufficient documentation

## 2014-10-13 DIAGNOSIS — Z3202 Encounter for pregnancy test, result negative: Secondary | ICD-10-CM | POA: Insufficient documentation

## 2014-10-13 DIAGNOSIS — E785 Hyperlipidemia, unspecified: Secondary | ICD-10-CM | POA: Diagnosis not present

## 2014-10-13 DIAGNOSIS — I10 Essential (primary) hypertension: Secondary | ICD-10-CM | POA: Insufficient documentation

## 2014-10-13 DIAGNOSIS — Z87891 Personal history of nicotine dependence: Secondary | ICD-10-CM | POA: Insufficient documentation

## 2014-10-13 DIAGNOSIS — Z9851 Tubal ligation status: Secondary | ICD-10-CM | POA: Insufficient documentation

## 2014-10-13 LAB — URINALYSIS, ROUTINE W REFLEX MICROSCOPIC
Bilirubin Urine: NEGATIVE
Glucose, UA: NEGATIVE mg/dL
Hgb urine dipstick: NEGATIVE
KETONES UR: NEGATIVE mg/dL
NITRITE: NEGATIVE
PROTEIN: NEGATIVE mg/dL
Specific Gravity, Urine: 1.021 (ref 1.005–1.030)
UROBILINOGEN UA: 0.2 mg/dL (ref 0.0–1.0)
pH: 7.5 (ref 5.0–8.0)

## 2014-10-13 LAB — COMPREHENSIVE METABOLIC PANEL
ALT: 38 U/L — AB (ref 0–35)
AST: 33 U/L (ref 0–37)
Albumin: 4.1 g/dL (ref 3.5–5.2)
Alkaline Phosphatase: 74 U/L (ref 39–117)
Anion gap: 8 (ref 5–15)
BUN: 19 mg/dL (ref 6–23)
CALCIUM: 9.8 mg/dL (ref 8.4–10.5)
CO2: 28 mmol/L (ref 19–32)
CREATININE: 0.8 mg/dL (ref 0.50–1.10)
Chloride: 107 mEq/L (ref 96–112)
GFR, EST NON AFRICAN AMERICAN: 84 mL/min — AB (ref >90)
GLUCOSE: 106 mg/dL — AB (ref 70–99)
Potassium: 3.6 mmol/L (ref 3.5–5.1)
Sodium: 143 mmol/L (ref 135–145)
Total Bilirubin: 0.5 mg/dL (ref 0.3–1.2)
Total Protein: 7 g/dL (ref 6.0–8.3)

## 2014-10-13 LAB — CBC WITH DIFFERENTIAL/PLATELET
BASOS ABS: 0 10*3/uL (ref 0.0–0.1)
BASOS PCT: 0 % (ref 0–1)
EOS ABS: 0.2 10*3/uL (ref 0.0–0.7)
EOS PCT: 2 % (ref 0–5)
HCT: 42.5 % (ref 36.0–46.0)
Hemoglobin: 14.5 g/dL (ref 12.0–15.0)
Lymphocytes Relative: 28 % (ref 12–46)
Lymphs Abs: 2.4 10*3/uL (ref 0.7–4.0)
MCH: 33 pg (ref 26.0–34.0)
MCHC: 34.1 g/dL (ref 30.0–36.0)
MCV: 96.8 fL (ref 78.0–100.0)
Monocytes Absolute: 0.5 10*3/uL (ref 0.1–1.0)
Monocytes Relative: 5 % (ref 3–12)
Neutro Abs: 5.7 10*3/uL (ref 1.7–7.7)
Neutrophils Relative %: 65 % (ref 43–77)
PLATELETS: 218 10*3/uL (ref 150–400)
RBC: 4.39 MIL/uL (ref 3.87–5.11)
RDW: 12.5 % (ref 11.5–15.5)
WBC: 8.7 10*3/uL (ref 4.0–10.5)

## 2014-10-13 LAB — URINE MICROSCOPIC-ADD ON

## 2014-10-13 LAB — RAPID URINE DRUG SCREEN, HOSP PERFORMED
Amphetamines: NOT DETECTED
BARBITURATES: NOT DETECTED
BENZODIAZEPINES: NOT DETECTED
Cocaine: NOT DETECTED
Opiates: NOT DETECTED
Tetrahydrocannabinol: NOT DETECTED

## 2014-10-13 LAB — PREGNANCY, URINE: PREG TEST UR: NEGATIVE

## 2014-10-13 MED ORDER — HYDROMORPHONE HCL 2 MG/ML IJ SOLN
2.0000 mg | Freq: Once | INTRAMUSCULAR | Status: AC
  Administered 2014-10-13: 2 mg via INTRAMUSCULAR
  Filled 2014-10-13: qty 1

## 2014-10-13 MED ORDER — IOHEXOL 300 MG/ML  SOLN
50.0000 mL | Freq: Once | INTRAMUSCULAR | Status: AC | PRN
  Administered 2014-10-13: 50 mL via ORAL

## 2014-10-13 MED ORDER — OXYCODONE-ACETAMINOPHEN 5-325 MG PO TABS: 1.0000 | ORAL_TABLET | ORAL | Status: DC | PRN

## 2014-10-13 MED ORDER — OXYCODONE-ACETAMINOPHEN 5-325 MG PO TABS
1.0000 | ORAL_TABLET | Freq: Once | ORAL | Status: AC
  Administered 2014-10-13: 1 via ORAL
  Filled 2014-10-13: qty 1

## 2014-10-13 MED ORDER — SODIUM CHLORIDE 0.9 % IV BOLUS (SEPSIS)
1000.0000 mL | Freq: Once | INTRAVENOUS | Status: AC
  Administered 2014-10-13: 1000 mL via INTRAVENOUS

## 2014-10-13 MED ORDER — IOHEXOL 300 MG/ML  SOLN
100.0000 mL | Freq: Once | INTRAMUSCULAR | Status: AC | PRN
  Administered 2014-10-13: 100 mL via INTRAVENOUS

## 2014-10-13 MED ORDER — OXYCODONE-ACETAMINOPHEN 5-325 MG PO TABS
1.0000 | ORAL_TABLET | Freq: Once | ORAL | Status: DC
  Filled 2014-10-13: qty 1

## 2014-10-13 MED ORDER — FENTANYL CITRATE 0.05 MG/ML IJ SOLN
50.0000 ug | Freq: Once | INTRAMUSCULAR | Status: AC
  Administered 2014-10-13: 50 ug via NASAL
  Filled 2014-10-13: qty 2

## 2014-10-13 MED ORDER — DIAZEPAM 5 MG PO TABS
10.0000 mg | ORAL_TABLET | Freq: Once | ORAL | Status: AC
  Administered 2014-10-13: 10 mg via ORAL
  Filled 2014-10-13: qty 2

## 2014-10-13 MED ORDER — HYDROMORPHONE HCL 1 MG/ML IJ SOLN
1.0000 mg | Freq: Once | INTRAMUSCULAR | Status: AC
  Administered 2014-10-13: 1 mg via INTRAVENOUS
  Filled 2014-10-13: qty 1

## 2014-10-13 MED ORDER — DIAZEPAM 10 MG PO TABS: 10.0000 mg | ORAL_TABLET | Freq: Four times a day (QID) | ORAL | Status: DC | PRN

## 2014-10-13 MED ORDER — ONDANSETRON 8 MG PO TBDP
8.0000 mg | ORAL_TABLET | Freq: Once | ORAL | Status: AC
  Administered 2014-10-13: 8 mg via ORAL
  Filled 2014-10-13: qty 1

## 2014-10-13 MED ORDER — ONDANSETRON HCL 4 MG/2ML IJ SOLN
4.0000 mg | Freq: Once | INTRAMUSCULAR | Status: AC
  Administered 2014-10-13: 4 mg via INTRAVENOUS
  Filled 2014-10-13: qty 2

## 2014-10-13 NOTE — ED Provider Notes (Signed)
CSN: [id number]     Arrival date & time 10/13/14  0814 History   First MD Initiated Contact with Patient 10/13/14 1007     Chief Complaint  Patient presents with  . abdominal pain   . Flank Pain     (Consider location/radiation/quality/duration/timing/severity/associated sxs/prior Treatment) HPI   Tammy [name] is a 53 y.o. female who was lying in bed, early this morning when she began coughing.  The coughing spell persisted and this caused her to become incontinent of urine.  As this happened, she sat up immediately and felt a pulling sensation in her left side.  This discomfort continued and is severe.  She states that it feels like something is twisted up inside of her.  She did not take any medication for this yet.  She denies other recent symptoms, including fever, chills, nausea, vomiting, weakness or dizziness.  The cough.  Today, is nonproductive.  There are no other known modifying factors.   Past Medical History  Diagnosis Date  . HYPERLIPIDEMIA 02/22/2008  . HYPERTENSION 02/22/2008  . Kidney stones    Past Surgical History  Procedure Laterality Date  . Tubal ligation  2006  . Induced abortion  age 28  . Kidney stone surgery  2011     [facility] Urology, right percutaneous  . Endometrial ablation    . Lap     Family History  Problem Relation Age of Onset  . Cirrhosis Mother 37    non alcoholic  . Cancer Father 76    bladder  . Colon cancer Neg Hx   . Pancreatic cancer Neg Hx   . Stomach cancer Neg Hx    History  Substance Use Topics  . Smoking status: Former Smoker -- 0.25 packs/day for 30 years  . Smokeless tobacco: Current User     Comment: E-CIG  . Alcohol Use: No   OB History    No data available     Review of Systems  All other systems reviewed and are negative.     Allergies  Varenicline tartrate  Home Medications   Prior to Admission medications   Medication Sig Start Date End Date Taking? Authorizing Provider  aspirin 81 MG tablet Take 81  mg by mouth daily.     Yes Historical Provider, MD  atorvastatin (LIPITOR) 20 MG tablet TAKE 1 TABLET (20 MG TOTAL) BY MOUTH DAILY. 09/02/14  Yes Eulas Post, MD  clonazePAM (KLONOPIN) 0.5 MG tablet TAKE 1 TABLET BY MOUTH EVERY OTHER DAY AS NEEDEDFOR ANXIETY 09/25/14  Yes Eulas Post, MD  fluticasone (FLONASE) 50 MCG/ACT nasal spray Place 1 spray into both nostrils daily as needed. Patient taking differently: Place 1 spray into both nostrils daily as needed for allergies.  09/14/13  Yes Lisabeth Pick, MD  guaiFENesin (MUCINEX) 600 MG 12 hr tablet Take 600 mg by mouth 2 (two) times daily as needed for cough or to loosen phlegm.   Yes Historical Provider, MD  hydrochlorothiazide (MICROZIDE) 12.5 MG capsule TAKE ONE CAPSULE BY MOUTH EVERY DAY 06/07/14  Yes Lisabeth Pick, MD  ibuprofen (ADVIL,MOTRIN) 200 MG tablet Take 400 mg by mouth every 6 (six) hours as needed. For pain   Yes Historical Provider, MD  methocarbamol (ROBAXIN) 500 MG tablet Take 1 tablet (500 mg total) by mouth every 8 (eight) hours as needed for muscle spasms. 09/25/14  Yes Eulas Post, MD  metoprolol (LOPRESSOR) 50 MG tablet TAKE 1 TABLET BY MOUTH TWICE A DAY 09/25/14  Yes  Eulas Post, MD  Nutritional Supplements (CANDIDA COMPLEX PO) Take 1 tablet by mouth daily.    Yes Historical Provider, MD  phenylephrine (SUDAFED PE) 10 MG TABS tablet Take 10 mg by mouth every 4 (four) hours as needed. Congestion   Yes Historical Provider, MD  potassium citrate (UROCIT-K) 10 MEQ (1080 MG) SR tablet Take 1 tablet (10 mEq total) by mouth 3 (three) times daily with meals. 08/28/14  Yes Eulas Post, MD  acyclovir (ZOVIRAX) 400 MG tablet TAKE 1 TABLET BY MOUTH TWICE A DAY Patient not taking: Reported on 10/13/2014 03/06/14   Lisabeth Pick, MD  diazepam (VALIUM) 10 MG tablet Take 1 tablet (10 mg total) by mouth every 6 (six) hours as needed (muscle spasm). 10/13/14   Richarda Blade, MD  oxyCODONE-acetaminophen (PERCOCET)  5-325 MG per tablet Take 1 tablet by mouth every 4 (four) hours as needed for severe pain. 10/13/14   Richarda Blade, MD   BP 99/53 mmHg  Pulse 68  Temp(Src) 97.7 F (36.5 C) (Oral)  Resp 18  SpO2 96% Physical Exam  Constitutional: She is oriented to person, place, and time. She appears well-developed and well-nourished.  HENT:  Head: Normocephalic and atraumatic.  Right Ear: External ear normal.  Left Ear: External ear normal.  Eyes: Conjunctivae and EOM are normal. Pupils are equal, round, and reactive to light.  Neck: Normal range of motion and phonation normal. Neck supple.  Cardiovascular: Normal rate, regular rhythm and normal heart sounds.   Pulmonary/Chest: Effort normal and breath sounds normal. She exhibits no bony tenderness.  Abdominal: Soft. There is no tenderness.  Musculoskeletal: Normal range of motion.  Exquisite left lateral flank tenderness to palpation.  She resists deep breathing, and rotation of the thorax, secondary to pain in the left flank.  Neurological: She is alert and oriented to person, place, and time. No cranial nerve deficit or sensory deficit. She exhibits normal muscle tone. Coordination normal.  Skin: Skin is warm, dry and intact.  Psychiatric: She has a normal mood and affect. Her behavior is normal. Judgment and thought content normal.  Nursing note and vitals reviewed.   ED Course  Procedures (including critical care time)  Medications  fentaNYL (SUBLIMAZE) injection 50 mcg (50 mcg Nasal Given 10/13/14 1004)  HYDROmorphone (DILAUDID) injection 2 mg (2 mg Intramuscular Given 10/13/14 1037)  ondansetron (ZOFRAN-ODT) disintegrating tablet 8 mg (8 mg Oral Given 10/13/14 1035)  ondansetron (ZOFRAN) injection 4 mg (4 mg Intravenous Given 10/13/14 1233)  HYDROmorphone (DILAUDID) injection 1 mg (1 mg Intravenous Given 10/13/14 1235)  sodium chloride 0.9 % bolus 1,000 mL (0 mLs Intravenous Stopped 10/13/14 1304)  iohexol (OMNIPAQUE) 300 MG/ML  solution 50 mL (50 mLs Oral Contrast Given 10/13/14 1240)  iohexol (OMNIPAQUE) 300 MG/ML solution 100 mL (100 mLs Intravenous Contrast Given 10/13/14 1403)  oxyCODONE-acetaminophen (PERCOCET/ROXICET) 5-325 MG per tablet 1 tablet (1 tablet Oral Given 10/13/14 1610)  diazepam (VALIUM) tablet 10 mg (10 mg Oral Given 10/13/14 1610)    Patient Vitals for the past 24 hrs:  BP Temp Temp src Pulse Resp SpO2  10/13/14 1615 (!) 99/53 mmHg - - 68 18 96 %  10/13/14 1417 (!) 104/42 mmHg - - 79 18 96 %  10/13/14 1129 (!) 98/54 mmHg - - 61 18 95 %  10/13/14 0900 147/64 mmHg 97.7 F (36.5 C) Oral 97 22 98 %    12:10 AM Reevaluation with update and discussion. After initial assessment and treatment, an updated evaluation reveals  she is now calm, alert, able to talk and states that she has had similar pain, for 3 months, in her left flank.  The pain is worse with coughing.  She also has an ongoing cough for several months.  The cough is nonproductive.  She states that she saw her PCP and he did x-rays of the abdomen and chest which were reported as normal.  She wants further evaluation and today to find out what is wrong with her. Madilyne Tadlock L  ,  Labs Review Labs Reviewed  COMPREHENSIVE METABOLIC PANEL - Abnormal; Notable for the following:    Glucose, Bld 106 (*)    ALT 38 (*)    GFR calc non Af Amer 84 (*)    All other components within normal limits  URINALYSIS, ROUTINE W REFLEX MICROSCOPIC - Abnormal; Notable for the following:    Leukocytes, UA SMALL (*)    All other components within normal limits  CBC WITH DIFFERENTIAL  PREGNANCY, URINE  URINE RAPID DRUG SCREEN (HOSP PERFORMED)  URINE MICROSCOPIC-ADD ON    Imaging Review Ct Abdomen Pelvis W Contrast  10/13/2014   CLINICAL DATA:  Flank pain  EXAM: CT ABDOMEN AND PELVIS WITH CONTRAST  TECHNIQUE: Multidetector CT imaging of the abdomen and pelvis was performed using the standard protocol following bolus administration of intravenous  contrast.  CONTRAST:  63mL OMNIPAQUE IOHEXOL 300 MG/ML SOLN, 12mL OMNIPAQUE IOHEXOL 300 MG/ML SOLN  COMPARISON:  04/15/2012  FINDINGS: Lung bases are free of acute infiltrate or sizable effusion. The liver is diffusely fatty infiltrated. The spleen, adrenal glands, pancreas and gallbladder are within normal limits. The kidneys demonstrate a normal enhancement pattern bilaterally. A few scattered cysts are noted. No obstructive changes are noted. The right renal collecting system again demonstrates a somewhat patulous appearance although no obstructive changes are seen. The bladder is well distended. The uterus and appendix are unremarkable. Mild diverticular changes again noted without diverticulitis. The bony structures show no acute bony abnormality. Mild aortoiliac calcifications are seen.  IMPRESSION: Bilateral renal cysts.  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  No acute abnormality is noted.   Electronically Signed   By: Inez Catalina M.D.   On: 10/13/2014 14:16     EKG Interpretation None      MDM   Final diagnoses:  Flank pain    Nonspecific subacute flank pain, with exacerbation today.  No evidence for kidney stones, UTI, intestinal blockage, intestinal infection or serious bacterial infection.  Doubt lumbar or thoracic radiculopathy.  Symptoms are most consistent with muscle strain.   Nursing Notes Reviewed/ Care Coordinated Applicable Imaging Reviewed Interpretation of Laboratory Data incorporated into ED treatment  The patient appears reasonably screened and/or stabilized for discharge and I doubt any other medical condition or other [facility] requiring further screening, evaluation, or treatment in the ED at this time prior to discharge.  Plan: Home Medications- Percocet, Valium; Home Treatments- rest, heat; return here if the recommended treatment, does not improve the symptoms; Recommended follow up- PCP prn    Richarda Blade, MD 10/13/14 1651

## 2014-10-13 NOTE — ED Notes (Signed)
Pt reports she was laying in bed and coughing, started urinating, so she jerked up, then started having left sided abdominal pain and flank pain. Pain 7/10. Hx of kidney stones, reports does not feel like that type of pain. Feels like "everything twisted". Denies n/v/d. Denies dysuria.

## 2014-10-13 NOTE — Discharge Instructions (Signed)
Flank Pain °Flank pain refers to pain that is located on the side of the body between the upper abdomen and the back. The pain may occur over a short period of time (acute) or may be long-term or reoccurring (chronic). It may be mild or severe. Flank pain can be caused by many things. °CAUSES  °Some of the more common causes of flank pain include: °· Muscle strains.   °· Muscle spasms.   °· A disease of your spine (vertebral disk disease).   °· A lung infection (pneumonia).   °· Fluid around your lungs (pulmonary edema).   °· A kidney infection.   °· Kidney stones.   °· A very painful skin rash caused by the chickenpox virus (shingles).   °· Gallbladder disease.   °HOME CARE INSTRUCTIONS  °Home care will depend on the cause of your pain. In general, °· Rest as directed by your caregiver. °· Drink enough fluids to keep your urine clear or pale yellow. °· Only take over-the-counter or prescription medicines as directed by your caregiver. Some medicines may help relieve the pain. °· Tell your caregiver about any changes in your pain. °· Follow up with your caregiver as directed. °SEEK IMMEDIATE MEDICAL CARE IF:  °· Your pain is not controlled with medicine.   °· You have new or worsening symptoms. °· Your pain increases.   °· You have abdominal pain.   °· You have shortness of breath.   °· You have persistent nausea or vomiting.   °· You have swelling in your abdomen.   °· You feel faint or pass out.   °· You have blood in your urine. °· You have a fever or persistent symptoms for more than 2-3 days. °· You have a fever and your symptoms suddenly get worse. °MAKE SURE YOU:  °· Understand these instructions. °· Will watch your condition. °· Will get help right away if you are not doing well or get worse. °Document Released: 11/25/2005 Document Revised: 06/28/2012 Document Reviewed: 05/18/2012 °[employer]® Patient Information ©2015 [employer]. This information is not intended to replace advice given to you by your  health care provider. Make sure you discuss any questions you have with your health care provider. ° °

## 2014-10-13 NOTE — ED Notes (Signed)
Pt back from CT. Pt able to tolerate PO fluid and crackers without problem.

## 2014-10-21 ENCOUNTER — Other Ambulatory Visit: Payer: Self-pay | Admitting: Family Medicine

## 2014-10-29 ENCOUNTER — Ambulatory Visit (INDEPENDENT_AMBULATORY_CARE_PROVIDER_SITE_OTHER)
Admission: RE | Admit: 2014-10-29 | Discharge: 2014-10-29 | Disposition: A | Discharge status: Home / Self Care | Payer: 59 | Source: Ambulatory Visit | Attending: Family Medicine | Admitting: Family Medicine

## 2014-10-29 ENCOUNTER — Ambulatory Visit (INDEPENDENT_AMBULATORY_CARE_PROVIDER_SITE_OTHER): Payer: 59 | Admitting: Family Medicine

## 2014-10-29 ENCOUNTER — Encounter: Payer: Self-pay | Admitting: Family Medicine

## 2014-10-29 VITALS — BP 110/70 | HR 82 | Temp 98.2°F | Wt 154.0 lb

## 2014-10-29 DIAGNOSIS — M546 Pain in thoracic spine: Secondary | ICD-10-CM

## 2014-10-29 MED ORDER — GABAPENTIN 300 MG PO CAPS: 300.0000 mg | ORAL_CAPSULE | Freq: Three times a day (TID) | ORAL | Status: DC

## 2014-10-29 MED ORDER — HYDROCODONE-ACETAMINOPHEN 5-325 MG PO TABS: 1.0000 | ORAL_TABLET | Freq: Four times a day (QID) | ORAL | Status: DC | PRN

## 2014-10-29 NOTE — Progress Notes (Signed)
Pre visit review using our clinic review tool, if applicable. No additional management support is needed unless otherwise documented below in the visit note. 

## 2014-10-29 NOTE — Progress Notes (Signed)
   Subjective:    Patient ID: Tammy [name], female    DOB: 12-28-1960, 54 y.o.   MRN: [id number]  HPI Patient seen with persistent mid thoracic back pain radiating to left side. Refer to prior note here and also to recent ED visit on 12/27 which was reviewed. She had CT abdomen and pelvis which showed some benign renal cysts but no acute findings. She's had pain now for about a month. No appetite or weight changes. Occasional cough. Recent chest x-ray unremarkable. She describes pain as sometimes sharp and sometimes achy possibly occasionally burning sensation radiating mid thoracic area somewhat anterior to about the midaxillary line. She's never had any rash. She has also had occasional left flank pain. History of recurrent kidney stones but this pain is different.  Pain is 7 out of 10 severity. She was recently prescribed oxycodone and diazepam but they did not seem to help much. She's not any fevers or chills. She has tried multiple things including topical heat and ice as well as topical rubs without improvement. Her pain is somewhat relieved with flexing her back forward. She did have temporary relief with Dilaudid in the ED  Past Medical History  Diagnosis Date  . HYPERLIPIDEMIA 02/22/2008  . HYPERTENSION 02/22/2008  . Kidney stones    Past Surgical History  Procedure Laterality Date  . Tubal ligation  2006  . Induced abortion  age 40  . Kidney stone surgery  2011     [facility] Urology, right percutaneous  . Endometrial ablation    . Lap      reports that she has quit smoking. She uses smokeless tobacco. She reports that she does not drink alcohol or use illicit drugs. family history includes Cancer (age of onset: 16) in her father; Cirrhosis (age of onset: 39) in her mother. There is no history of Colon cancer, Pancreatic cancer, or Stomach cancer. Allergies  Allergen Reactions  . Varenicline Tartrate Other (See Comments)    Suicidal thoughts (Chantix)      Review of Systems    Constitutional: Negative for fever, chills, appetite change and unexpected weight change.  Respiratory: Negative for cough.   Cardiovascular: Negative for chest pain.  Gastrointestinal: Negative for abdominal pain.  Musculoskeletal: Positive for back pain.  Skin: Negative for rash.  Neurological: Negative for dizziness.  Hematological: Negative for adenopathy.       Objective:   Physical Exam  Constitutional: She appears well-developed and well-nourished.  Cardiovascular: Normal rate and regular rhythm.  Exam reveals no gallop.   Pulmonary/Chest: Effort normal and breath sounds normal. No respiratory distress. She has no wheezes. She has no rales.  Musculoskeletal:  She has some tenderness over mid thoracic area around T6. She also some muscular tenderness just left of the spine over a larger area  Skin: No rash noted.          Assessment & Plan:  Patient presents with persistent somewhat poorly localized left thoracic back pain. No red flags such as fever, appetite or weight change, night sweats, etc but location is unusual.  No rash. She does have some radiation slightly anteriorly which suggest possible neuropathic origin though her pain is worse with movement. Given duration of symptoms will get thoracic spine films. Trial of gabapentin 300 mg daily at bedtime and titrate to one 3 times a day. Reassess one week. Limited hydrocodone 5 mg 1 every 6 hours for severe pain

## 2014-10-30 ENCOUNTER — Other Ambulatory Visit: Payer: Self-pay | Admitting: Family Medicine

## 2014-10-30 ENCOUNTER — Telehealth: Payer: Self-pay | Admitting: Family Medicine

## 2014-10-30 DIAGNOSIS — M546 Pain in thoracic spine: Secondary | ICD-10-CM

## 2014-10-30 NOTE — Telephone Encounter (Signed)
Pt informed

## 2014-10-30 NOTE — Telephone Encounter (Signed)
Pt would like a call back she said she can not remember what the instructions are about the following med    gabapentin (NEURONTIN) 300 MG capsule

## 2014-11-07 ENCOUNTER — Ambulatory Visit (INDEPENDENT_AMBULATORY_CARE_PROVIDER_SITE_OTHER): Payer: 59 | Admitting: Family Medicine

## 2014-11-07 ENCOUNTER — Encounter: Payer: Self-pay | Admitting: Family Medicine

## 2014-11-07 VITALS — BP 120/70 | HR 68 | Temp 97.8°F | Wt 155.0 lb

## 2014-11-07 DIAGNOSIS — M546 Pain in thoracic spine: Secondary | ICD-10-CM

## 2014-11-07 MED ORDER — GABAPENTIN 100 MG PO CAPS: ORAL_CAPSULE | ORAL | Status: DC

## 2014-11-07 NOTE — Progress Notes (Signed)
Pre visit review using our clinic review tool, if applicable. No additional management support is needed unless otherwise documented below in the visit note. 

## 2014-11-07 NOTE — Progress Notes (Signed)
Subjective:    Patient ID: Tammy [name], female    DOB: [birth year], 54 y.o.   MRN: [id number]  HPI Patient seen for follow-up regarding her upper to mid thoracic back pain. Refer to prior note:  "Patient seen with persistent mid thoracic back pain radiating to left side. Refer to prior note here and also to recent ED visit on 12/27 which was reviewed. She had CT abdomen and pelvis which showed some benign renal cysts but no acute findings. She's had pain now for about a month. No appetite or weight changes. Occasional cough. Recent chest x-ray unremarkable. She describes pain as sometimes sharp and sometimes achy possibly occasionally burning sensation radiating mid thoracic area somewhat anterior to about the midaxillary line. She's never had any rash. She has also had occasional left flank pain. History of recurrent kidney stones but this pain is different.  Pain is 7 out of 10 severity. She was recently prescribed oxycodone and diazepam but they did not seem to help much. She's not any fevers or chills. She has tried multiple things including topical heat and ice as well as topical rubs without improvement. Her pain is somewhat relieved with flexing her back forward. She did have temporary relief with Dilaudid in the ED"  Her pain is significant improvement after starting gabapentin. In fact, her pain was 8 out of 10 at times and now mostly 0-1 out of 10. She does feel this is made her feel "loopy".  She's had some mild sedation with this but those side effects seem to be easing as well. She would like to try possibly tapering back her dose slightly. Otherwise no side effects. She had recent plain films thoracic which showed increased density pedicle T7 of uncertain significance. She has pending MRI thoracic spine. No recent appetite or weight loss. No fevers or chills. No skin rash  Past Medical History  Diagnosis Date  . HYPERLIPIDEMIA 02/22/2008  . HYPERTENSION 02/22/2008  . Kidney stones     Past Surgical History  Procedure Laterality Date  . Tubal ligation  2006  . Induced abortion  age 8  . Kidney stone surgery  2011     [facility] Urology, right percutaneous  . Endometrial ablation    . Lap      reports that she has quit smoking. She uses smokeless tobacco. She reports that she does not drink alcohol or use illicit drugs. family history includes Cancer (age of onset: 28) in her father; Cirrhosis (age of onset: 50) in her mother. There is no history of Colon cancer, Pancreatic cancer, or Stomach cancer. Allergies  Allergen Reactions  . Varenicline Tartrate Other (See Comments)    Suicidal thoughts (Chantix)        Review of Systems  Constitutional: Negative for fever, chills, appetite change and unexpected weight change.  Respiratory: Negative for shortness of breath.   Cardiovascular: Negative for chest pain.  Skin: Negative for rash.  Hematological: Negative for adenopathy.       Objective:   Physical Exam  Constitutional: She appears well-developed and well-nourished.  Cardiovascular: Normal rate and regular rhythm.   Pulmonary/Chest: Effort normal and breath sounds normal. No respiratory distress. She has no wheezes. She has no rales.  Musculoskeletal:  Continued tenderness around T6-T7 region.          Assessment & Plan:  Thoracic spine pain. She's had fairly extensive workup thus far but pending thoracic MRI. She's had tremendous improvement with gabapentin which suggests possible neuropathic origin. We  will try tapering her back to 200 mg 3 times a day since she has had favorable response but with  medication but with side effects as above.

## 2014-11-09 ENCOUNTER — Inpatient Hospital Stay: Admission: RE | Admit: 2014-11-09 | Payer: 59 | Source: Ambulatory Visit

## 2014-11-15 ENCOUNTER — Ambulatory Visit
Admission: RE | Admit: 2014-11-15 | Discharge: 2014-11-15 | Disposition: A | Discharge status: Home / Self Care | Payer: 59 | Source: Ambulatory Visit | Attending: Family Medicine | Admitting: Family Medicine

## 2014-11-15 DIAGNOSIS — M546 Pain in thoracic spine: Secondary | ICD-10-CM

## 2014-11-16 ENCOUNTER — Other Ambulatory Visit: Payer: 59

## 2014-11-18 ENCOUNTER — Other Ambulatory Visit: Payer: 59

## 2014-11-23 ENCOUNTER — Other Ambulatory Visit: Payer: Self-pay | Admitting: Internal Medicine

## 2014-11-25 ENCOUNTER — Other Ambulatory Visit: Payer: Self-pay

## 2014-11-25 MED ORDER — HYDROCHLOROTHIAZIDE 12.5 MG PO CAPS: 12.5000 mg | ORAL_CAPSULE | Freq: Every day | ORAL | Status: DC

## 2015-01-20 ENCOUNTER — Other Ambulatory Visit: Payer: Self-pay | Admitting: Family Medicine

## 2015-02-18 ENCOUNTER — Ambulatory Visit (INDEPENDENT_AMBULATORY_CARE_PROVIDER_SITE_OTHER): Payer: 59 | Admitting: Family Medicine

## 2015-02-18 ENCOUNTER — Encounter: Payer: Self-pay | Admitting: Family Medicine

## 2015-02-18 ENCOUNTER — Ambulatory Visit: Payer: 59 | Admitting: Family Medicine

## 2015-02-18 VITALS — BP 110/70 | HR 73 | Temp 98.4°F | Wt 153.0 lb

## 2015-02-18 DIAGNOSIS — I10 Essential (primary) hypertension: Secondary | ICD-10-CM | POA: Diagnosis not present

## 2015-02-18 DIAGNOSIS — M546 Pain in thoracic spine: Secondary | ICD-10-CM | POA: Diagnosis not present

## 2015-02-18 DIAGNOSIS — E785 Hyperlipidemia, unspecified: Secondary | ICD-10-CM | POA: Diagnosis not present

## 2015-02-18 MED ORDER — GABAPENTIN 300 MG PO CAPS: 300.0000 mg | ORAL_CAPSULE | Freq: Three times a day (TID) | ORAL | Status: DC

## 2015-02-18 MED ORDER — HYDROCODONE-ACETAMINOPHEN 5-325 MG PO TABS: 1.0000 | ORAL_TABLET | Freq: Four times a day (QID) | ORAL | Status: DC | PRN

## 2015-02-18 NOTE — Progress Notes (Signed)
Pre visit review using our clinic review tool, if applicable. No additional management support is needed unless otherwise documented below in the visit note. 

## 2015-02-18 NOTE — Progress Notes (Signed)
   Subjective:    Patient ID: Tammy [name], female    DOB: 09-15-1961, 54 y.o.   MRN: [id number]  HPI Patient for medical follow-up.  Chronic problems include ongoing nicotine use, hyperlipidemia, hypertension, history of recurrent kidney stones.  Bilateral thoracic back pain. She had extensive workup including thoracic MRI which showed severe degenerative changes T7-T8 but no other acute abnormalities. No spinal stenosis and no disc herniation. Pain fairly well controlled gabapentin 300 mg 3 times a day. She supplements rarely with hydrocodone. Requesting refill. Symptoms remained relatively stable with sometimes worse with activity  Hypertension treated with HCTZ and metoprolol. No dizziness. No headaches. No chest pains.  Hyperlipidemia on generic Lipitor. No myalgias. No history of CAD. No known peripheral vascular disease.  Past Medical History  Diagnosis Date  . HYPERLIPIDEMIA 02/22/2008  . HYPERTENSION 02/22/2008  . Kidney stones    Past Surgical History  Procedure Laterality Date  . Tubal ligation  2006  . Induced abortion  age 22  . Kidney stone surgery  2011     [facility], right percutaneous  . Endometrial ablation    . Lap      reports that she has quit smoking. She uses smokeless tobacco. She reports that she does not drink alcohol or use illicit drugs. family history includes Cancer (age of onset: 22) in her father; Cirrhosis (age of onset: 84) in her mother. There is no history of Colon cancer, Pancreatic cancer, or Stomach cancer. Allergies  Allergen Reactions  . Varenicline Tartrate Other (See Comments)    Suicidal thoughts (Chantix)      Review of Systems  Constitutional: Negative for fatigue and unexpected weight change.  Eyes: Negative for visual disturbance.  Respiratory: Negative for cough, chest tightness, shortness of breath and wheezing.   Cardiovascular: Negative for chest pain, palpitations and leg swelling.  Endocrine: Negative for polydipsia  and polyuria.  Genitourinary: Negative for dysuria.  Musculoskeletal: Positive for back pain.  Neurological: Negative for dizziness, seizures, syncope, weakness, light-headedness and headaches.       Objective:   Physical Exam  Constitutional: She appears well-developed and well-nourished.  Neck: Neck supple. No thyromegaly present.  Cardiovascular: Normal rate and regular rhythm.   Pulmonary/Chest: Effort normal and breath sounds normal. No respiratory distress. She has no wheezes. She has no rales.  Musculoskeletal: She exhibits no edema.  Lymphadenopathy:    She has no cervical adenopathy.          Assessment & Plan:  #1 chronic bilateral thoracic back pain. 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 Greatly improved on gabapentin. Refills given for 6 months. If her pain at any point is not adequately controlled consider addition of medication such as Cymbalta. We did give one refill only of hydrocodone 5 mg to use for severe breakthrough pain #30 with no refill #2 hypertension which is stable and at goal. Check basic metabolic panel at follow-up #3 hyperlipidemia. Continue Lipitor. Tolerating well no side effects. Fasting lipids at follow-up

## 2015-02-26 ENCOUNTER — Encounter: Payer: Self-pay | Admitting: Family Medicine

## 2015-02-27 ENCOUNTER — Other Ambulatory Visit: Payer: Self-pay

## 2015-02-27 MED ORDER — METAXALONE 800 MG PO TABS: 800.0000 mg | ORAL_TABLET | Freq: Three times a day (TID) | ORAL | Status: DC | PRN

## 2015-03-05 ENCOUNTER — Other Ambulatory Visit: Payer: Self-pay | Admitting: Internal Medicine

## 2015-03-16 ENCOUNTER — Other Ambulatory Visit: Payer: Self-pay | Admitting: Family Medicine

## 2015-04-12 ENCOUNTER — Other Ambulatory Visit: Payer: Self-pay | Admitting: Internal Medicine

## 2015-04-12 ENCOUNTER — Other Ambulatory Visit: Payer: Self-pay | Admitting: Family Medicine

## 2015-05-07 ENCOUNTER — Other Ambulatory Visit: Payer: Self-pay | Admitting: Family Medicine

## 2015-05-15 ENCOUNTER — Ambulatory Visit (INDEPENDENT_AMBULATORY_CARE_PROVIDER_SITE_OTHER): Payer: 59 | Admitting: Family Medicine

## 2015-05-15 ENCOUNTER — Encounter: Payer: Self-pay | Admitting: Family Medicine

## 2015-05-15 VITALS — BP 110/80 | HR 83 | Temp 98.6°F | Wt 151.0 lb

## 2015-05-15 DIAGNOSIS — R058 Other specified cough: Secondary | ICD-10-CM

## 2015-05-15 DIAGNOSIS — R05 Cough: Secondary | ICD-10-CM | POA: Diagnosis not present

## 2015-05-15 MED ORDER — MONTELUKAST SODIUM 10 MG PO TABS: 10.0000 mg | ORAL_TABLET | Freq: Every day | ORAL | Status: DC

## 2015-05-15 NOTE — Patient Instructions (Signed)
Touch base in 2 weeks if cough not improved.

## 2015-05-15 NOTE — Progress Notes (Signed)
   Subjective:    Patient ID: Tammy [name], female    DOB: 02/10/1961, 54 y.o.   MRN: [id number]  HPI Patient is seen with cough for the past few weeks. Frequent nasal congestion and sneezing. She's tried over-the-counter Mucinex DM and Allegra without improvement. No hemoptysis. No fevers or chills. Occasional ear fullness. She has long history of smoking. No recent appetite or weight changes. No dyspnea. No obvious wheezing. She thinks this maybe allergy related  Past Medical History  Diagnosis Date  . HYPERLIPIDEMIA 02/22/2008  . HYPERTENSION 02/22/2008  . Kidney stones    Past Surgical History  Procedure Laterality Date  . Tubal ligation  2006  . Induced abortion  age 75  . Kidney stone surgery  2011     [facility], right percutaneous  . Endometrial ablation    . Lap      reports that she has quit smoking. She uses smokeless tobacco. She reports that she does not drink alcohol or use illicit drugs. family history includes Cancer (age of onset: 32) in her father; Cirrhosis (age of onset: 66) in her mother. There is no history of Colon cancer, Pancreatic cancer, or Stomach cancer. Allergies  Allergen Reactions  . Varenicline Tartrate Other (See Comments)    Suicidal thoughts (Chantix)      Review of Systems  Constitutional: Negative for fever and chills.  HENT: Positive for congestion.   Respiratory: Positive for cough. Negative for shortness of breath and wheezing.   Cardiovascular: Negative for chest pain.       Objective:   Physical Exam  Constitutional: She appears well-developed and well-nourished. No distress.  HENT:  Right Ear: External ear normal.  Left Ear: External ear normal.  Mouth/Throat: Oropharynx is clear and moist.  Neck: Neck supple.  Cardiovascular: Normal rate and regular rhythm.   Pulmonary/Chest: Effort normal and breath sounds normal. No respiratory distress. She has no wheezes. She has no rales.  Lymphadenopathy:    She has no cervical  adenopathy.          Assessment & Plan:  Cough. Suspect allergic. Continue Allegra. Add Singulair 10 mg once daily. Also continue Flonase. Consider addition of Astelin nasal in 2 weeks if not improved

## 2015-05-15 NOTE — Progress Notes (Signed)
Pre visit review using our clinic review tool, if applicable. No additional management support is needed unless otherwise documented below in the visit note. 

## 2015-05-28 ENCOUNTER — Other Ambulatory Visit: Payer: Self-pay | Admitting: Family Medicine

## 2015-05-29 ENCOUNTER — Encounter: Payer: Self-pay | Admitting: Family Medicine

## 2015-05-29 ENCOUNTER — Ambulatory Visit (INDEPENDENT_AMBULATORY_CARE_PROVIDER_SITE_OTHER): Payer: 59 | Admitting: Family Medicine

## 2015-05-29 VITALS — BP 110/80 | HR 82 | Temp 99.3°F | Wt 149.0 lb

## 2015-05-29 DIAGNOSIS — R05 Cough: Secondary | ICD-10-CM

## 2015-05-29 DIAGNOSIS — R059 Cough, unspecified: Secondary | ICD-10-CM

## 2015-05-29 MED ORDER — HYDROCODONE-HOMATROPINE 5-1.5 MG/5ML PO SYRP: 5.0000 mL | ORAL_SOLUTION | Freq: Four times a day (QID) | ORAL | Status: AC | PRN

## 2015-05-29 NOTE — Patient Instructions (Signed)
Touch base by next week if cough no better.

## 2015-05-29 NOTE — Progress Notes (Signed)
Pre visit review using our clinic review tool, if applicable. No additional management support is needed unless otherwise documented below in the visit note. 

## 2015-05-29 NOTE — Progress Notes (Signed)
   Subjective:    Patient ID: Tammy [name], female    DOB: 1961/08/10, 54 y.o.   MRN: [id number]  HPI Patient seen with persistent cough. Refer to prior note. She had quit smoking but now has reverted back about half pack a day. She's not had any hemoptysis. She's had some mild weight loss due to her efforts. She is exercising with exercise bike couple times a day. No dyspnea at rest. Her cough is mostly nonproductive. She denies any GERD. No wheezing. No chronic sinusitis symptoms. She has some allergic postnasal drip symptoms. She's tried multiple things including Allegra, Mucinex DM, Flonase, and Singulair without improvement. She tried some recent Tessalon that her husband had and did not help her cough. She does work around children frequently.  Past Medical History  Diagnosis Date  . HYPERLIPIDEMIA 02/22/2008  . HYPERTENSION 02/22/2008  . Kidney stones    Past Surgical History  Procedure Laterality Date  . Tubal ligation  2006  . Induced abortion  age 53  . Kidney stone surgery  2011     [facility], right percutaneous  . Endometrial ablation    . Lap      reports that she has quit smoking. She uses smokeless tobacco. She reports that she does not drink alcohol or use illicit drugs. family history includes Cancer (age of onset: 33) in her father; Cirrhosis (age of onset: 52) in her mother. There is no history of Colon cancer, Pancreatic cancer, or Stomach cancer. Allergies  Allergen Reactions  . Varenicline Tartrate Other (See Comments)    Suicidal thoughts (Chantix)      Review of Systems  Constitutional: Negative for fever, chills, appetite change and unexpected weight change.  HENT: Negative for congestion.   Respiratory: Positive for cough. Negative for shortness of breath and wheezing.        Objective:   Physical Exam  Constitutional: She appears well-developed and well-nourished.  HENT:  Right Ear: External ear normal.  Left Ear: External ear normal.    Mouth/Throat: Oropharynx is clear and moist.  Neck: Neck supple.  Cardiovascular: Normal rate and regular rhythm.   Pulmonary/Chest: Effort normal and breath sounds normal. No respiratory distress. She has no wheezes. She has no rales.  Lymphadenopathy:    She has no cervical adenopathy.          Assessment & Plan:  Persistent cough. Question post viral bronchitis versus allergic postnasal drip. She's tried multiple allergy medications as above without relief. Try Hycodan cough syrup at night for suppression and if cough not resolving by next week chest x-ray and further evaluation.

## 2015-06-06 ENCOUNTER — Telehealth: Payer: Self-pay | Admitting: Family Medicine

## 2015-06-06 NOTE — Telephone Encounter (Signed)
Pt states she was told by Dr. Elease Hashimoto to call back today to let him know if she was feeling better.  Pt reports she is feeling much better and that her cough is not as bad as it was before.  Her cough is still there but it has significantly improved.

## 2015-06-09 ENCOUNTER — Other Ambulatory Visit: Payer: Self-pay | Admitting: Family Medicine

## 2015-06-09 NOTE — Telephone Encounter (Signed)
Last visit 05/29/15 Last refill 09/25/14 #20 1 refill

## 2015-06-10 ENCOUNTER — Other Ambulatory Visit: Payer: Self-pay | Admitting: Family Medicine

## 2015-06-10 NOTE — Telephone Encounter (Signed)
Refill once.  Avoid regular use. 

## 2015-06-25 ENCOUNTER — Other Ambulatory Visit: Payer: Self-pay | Admitting: *Deleted

## 2015-06-25 MED ORDER — METOPROLOL TARTRATE 50 MG PO TABS: 50.0000 mg | ORAL_TABLET | Freq: Two times a day (BID) | ORAL | Status: DC

## 2015-07-01 ENCOUNTER — Other Ambulatory Visit: Payer: Self-pay | Admitting: Family Medicine

## 2015-07-01 MED ORDER — GABAPENTIN 300 MG PO CAPS: 300.0000 mg | ORAL_CAPSULE | Freq: Three times a day (TID) | ORAL | Status: DC

## 2015-07-01 NOTE — Telephone Encounter (Signed)
Received a refill request for 90 day supply.  Filled on 02/18/15 for six months but 30 day supply only.  Has a follow up on 07/21/15.  Sent in ninety days by e-scribe.

## 2015-07-21 ENCOUNTER — Ambulatory Visit (INDEPENDENT_AMBULATORY_CARE_PROVIDER_SITE_OTHER): Payer: 59 | Admitting: Family Medicine

## 2015-07-21 VITALS — BP 110/60 | HR 67 | Temp 97.8°F | Ht 61.0 in | Wt 146.0 lb

## 2015-07-21 DIAGNOSIS — I10 Essential (primary) hypertension: Secondary | ICD-10-CM | POA: Diagnosis not present

## 2015-07-21 DIAGNOSIS — R7303 Prediabetes: Secondary | ICD-10-CM

## 2015-07-21 DIAGNOSIS — L821 Other seborrheic keratosis: Secondary | ICD-10-CM

## 2015-07-21 DIAGNOSIS — M79671 Pain in right foot: Secondary | ICD-10-CM

## 2015-07-21 DIAGNOSIS — E785 Hyperlipidemia, unspecified: Secondary | ICD-10-CM | POA: Diagnosis not present

## 2015-07-21 DIAGNOSIS — Z23 Encounter for immunization: Secondary | ICD-10-CM | POA: Diagnosis not present

## 2015-07-21 MED ORDER — ATORVASTATIN CALCIUM 20 MG PO TABS: 20.0000 mg | ORAL_TABLET | Freq: Every day | ORAL | Status: DC

## 2015-07-21 NOTE — Progress Notes (Signed)
Pre visit review using our clinic review tool, if applicable. No additional management support is needed unless otherwise documented below in the visit note. 

## 2015-07-21 NOTE — Progress Notes (Signed)
   Subjective:    Patient ID: Tammy [name], female    DOB: 1961-07-15, 54 y.o.   MRN: [id number]  HPI Patient here for multiple issues  Hyperlipidemia treated with atorvastatin. She is not fasting today. She states she is compliant with therapy. No history of CAD  Hypertension which is been treated with metoprolol and HCTZ. Blood pressure been stable. No recent dizziness.  Brown scaly skin lesion right side. Minimally pruritic. Nonpainful. Not changing in size  Frequent right foot pains. Worse at night. Pain very poorly localized dorsum right foot. No swelling. No recent injury. Heat improves. No recent change of shoe wear.  Past Medical History  Diagnosis Date  . HYPERLIPIDEMIA 02/22/2008  . HYPERTENSION 02/22/2008  . Kidney stones    Past Surgical History  Procedure Laterality Date  . Tubal ligation  2006  . Induced abortion  age 64  . Kidney stone surgery  2011     [facility], right percutaneous  . Endometrial ablation    . Lap      reports that she has quit smoking. She uses smokeless tobacco. She reports that she does not drink alcohol or use illicit drugs. family history includes Cancer (age of onset: 45) in her father; Cirrhosis (age of onset: 50) in her mother. There is no history of Colon cancer, Pancreatic cancer, or Stomach cancer. Allergies  Allergen Reactions  . Varenicline Tartrate Other (See Comments)    Suicidal thoughts (Chantix)  '    Review of Systems  Constitutional: Negative for appetite change, fatigue and unexpected weight change.  Eyes: Negative for visual disturbance.  Respiratory: Negative for cough, chest tightness, shortness of breath and wheezing.   Cardiovascular: Negative for chest pain, palpitations and leg swelling.  Gastrointestinal: Negative for abdominal pain.  Endocrine: Negative for polydipsia and polyuria.  Musculoskeletal: Positive for back pain and arthralgias.  Neurological: Negative for dizziness, seizures, syncope,  weakness, light-headedness and headaches.       Objective:   Physical Exam  Constitutional: She appears well-developed and well-nourished.  Neck: Neck supple. No thyromegaly present.  Cardiovascular: Normal rate and regular rhythm.  Exam reveals no gallop.   No murmur heard. Pulmonary/Chest: Effort normal and breath sounds normal. No respiratory distress. She has no wheezes. She has no rales.  Musculoskeletal: She exhibits no edema.  Right foot is warm to touch. Excellent distal pulses. No bony tenderness. No edema. No ecchymosis. Full range of motion ankle  Lymphadenopathy:    She has no cervical adenopathy.  Skin:  4-5 mm well-demarcated brownish scaly skin lesion right trunk consistent with seborrheic keratosis          Assessment & Plan:  #1 hypertension well controlled. Continue current medications #2 hyperlipidemia. Scheduled labs for fasting lipid and hepatic panel #3 benign seborrheic keratosis. Reassurance #4 right foot pain. Normal exam. No reproducible tenderness. Question tendinitis. Try some icing at the end of activities

## 2015-07-22 ENCOUNTER — Other Ambulatory Visit (INDEPENDENT_AMBULATORY_CARE_PROVIDER_SITE_OTHER): Payer: 59

## 2015-07-22 DIAGNOSIS — E785 Hyperlipidemia, unspecified: Secondary | ICD-10-CM

## 2015-07-22 DIAGNOSIS — I10 Essential (primary) hypertension: Secondary | ICD-10-CM | POA: Diagnosis not present

## 2015-07-22 LAB — LIPID PANEL
Cholesterol: 133 mg/dL (ref 0–200)
HDL: 31.3 mg/dL — AB (ref >39.00)
NONHDL: 101.56
Total CHOL/HDL Ratio: 4
Triglycerides: 289 mg/dL — ABNORMAL HIGH (ref 0.0–149.0)
VLDL: 57.8 mg/dL — ABNORMAL HIGH (ref 0.0–40.0)

## 2015-07-22 LAB — HEPATIC FUNCTION PANEL
ALK PHOS: 87 U/L (ref 39–117)
ALT: 16 U/L (ref 0–35)
AST: 15 U/L (ref 0–37)
Albumin: 4.3 g/dL (ref 3.5–5.2)
BILIRUBIN DIRECT: 0.1 mg/dL (ref 0.0–0.3)
TOTAL PROTEIN: 7.2 g/dL (ref 6.0–8.3)
Total Bilirubin: 0.5 mg/dL (ref 0.2–1.2)

## 2015-07-22 LAB — BASIC METABOLIC PANEL
BUN: 10 mg/dL (ref 6–23)
CALCIUM: 10.1 mg/dL (ref 8.4–10.5)
CO2: 31 meq/L (ref 19–32)
Chloride: 104 mEq/L (ref 96–112)
Creatinine, Ser: 0.85 mg/dL (ref 0.40–1.20)
GFR: 74.51 mL/min (ref >60.00)
Glucose, Bld: 109 mg/dL — ABNORMAL HIGH (ref 70–99)
POTASSIUM: 4.2 meq/L (ref 3.5–5.1)
SODIUM: 145 meq/L (ref 135–145)

## 2015-07-22 LAB — LDL CHOLESTEROL, DIRECT: Direct LDL: 67 mg/dL

## 2015-07-23 ENCOUNTER — Encounter: Payer: Self-pay | Admitting: Family Medicine

## 2015-07-31 ENCOUNTER — Other Ambulatory Visit: Payer: Self-pay | Admitting: Family Medicine

## 2015-08-29 ENCOUNTER — Other Ambulatory Visit: Payer: Self-pay | Admitting: Family Medicine

## 2015-08-31 NOTE — Telephone Encounter (Signed)
Refill once.  Avoid regular use. 

## 2015-09-15 ENCOUNTER — Other Ambulatory Visit: Payer: Self-pay | Admitting: Family Medicine

## 2015-09-23 ENCOUNTER — Other Ambulatory Visit: Payer: Self-pay | Admitting: Family Medicine

## 2015-09-26 ENCOUNTER — Encounter: Payer: Self-pay | Admitting: Family Medicine

## 2015-09-26 ENCOUNTER — Ambulatory Visit: Payer: 59 | Admitting: Family Medicine

## 2015-09-26 ENCOUNTER — Ambulatory Visit (INDEPENDENT_AMBULATORY_CARE_PROVIDER_SITE_OTHER): Payer: 59 | Admitting: Family Medicine

## 2015-09-26 VITALS — BP 115/67 | HR 88 | Temp 99.0°F | Ht 61.0 in | Wt 144.0 lb

## 2015-09-26 DIAGNOSIS — J209 Acute bronchitis, unspecified: Secondary | ICD-10-CM

## 2015-09-26 MED ORDER — ALBUTEROL SULFATE HFA 108 (90 BASE) MCG/ACT IN AERS: 2.0000 | INHALATION_SPRAY | RESPIRATORY_TRACT | Status: DC | PRN

## 2015-09-26 MED ORDER — AMOXICILLIN-POT CLAVULANATE 875-125 MG PO TABS: 1.0000 | ORAL_TABLET | Freq: Two times a day (BID) | ORAL | Status: DC

## 2015-09-26 MED ORDER — HYDROCODONE-HOMATROPINE 5-1.5 MG/5ML PO SYRP: 5.0000 mL | ORAL_SOLUTION | Freq: Four times a day (QID) | ORAL | Status: DC | PRN

## 2015-09-26 NOTE — Progress Notes (Signed)
Pre visit review using our clinic review tool, if applicable. No additional management support is needed unless otherwise documented below in the visit note. 

## 2015-09-26 NOTE — Progress Notes (Signed)
   Subjective:    Patient ID: Tammy [name], female    DOB: 20-Nov-1960, 54 y.o.   MRN: [id number]  HPI Here for 5 days of low grade fever, chest congestion and coughing up yellow sputum. No chest pain. On Mucinex.    Review of Systems  Constitutional: Positive for fever.  HENT: Positive for congestion. Negative for postnasal drip, sinus pressure and sore throat.   Eyes: Negative.   Respiratory: Positive for cough, chest tightness, shortness of breath and wheezing.   Cardiovascular: Negative.        Objective:   Physical Exam  Constitutional: She appears well-developed and well-nourished.  HENT:  Right Ear: External ear normal.  Left Ear: External ear normal.  Nose: Nose normal.  Mouth/Throat: Oropharynx is clear and moist.  Eyes: Conjunctivae are normal.  Neck: No thyromegaly present.  Cardiovascular: Normal rate, regular rhythm, normal heart sounds and intact distal pulses.   Pulmonary/Chest: Effort normal. No respiratory distress. She has no rales.  Scattered wheezes and rhonchi  Lymphadenopathy:    She has no cervical adenopathy.          Assessment & Plan:  Bronchitis, treat with Augmentin and an albuterol inhaler

## 2015-10-17 ENCOUNTER — Encounter: Payer: Self-pay | Admitting: Family Medicine

## 2015-10-17 ENCOUNTER — Ambulatory Visit (INDEPENDENT_AMBULATORY_CARE_PROVIDER_SITE_OTHER): Payer: 59 | Admitting: Family Medicine

## 2015-10-17 VITALS — BP 122/80 | HR 95 | Temp 98.7°F | Resp 16 | Ht 61.0 in | Wt 143.0 lb

## 2015-10-17 DIAGNOSIS — J0191 Acute recurrent sinusitis, unspecified: Secondary | ICD-10-CM

## 2015-10-17 MED ORDER — HYDROCODONE-HOMATROPINE 5-1.5 MG/5ML PO SYRP: 5.0000 mL | ORAL_SOLUTION | Freq: Four times a day (QID) | ORAL | Status: DC | PRN

## 2015-10-17 MED ORDER — LEVOFLOXACIN 500 MG PO TABS: 500.0000 mg | ORAL_TABLET | Freq: Every day | ORAL | Status: DC

## 2015-10-17 NOTE — Patient Instructions (Signed)

## 2015-10-17 NOTE — Progress Notes (Signed)
   Subjective:    Patient ID: Tammy [name], female    DOB: 1961/03/30, 54 y.o.   MRN: [id number]  HPI Acute visit Patient seen with persistent sinusitis symptoms. Onset about a month ago. Initially seen here August night and treated with Augmentin. She had pansinusitis symptoms then. Did improve after Augmentin but never fully resolved. She has persistent cough and maxillary sinus pressure and upper teeth pain. Intermittent sore throat. Increased facial pressure. Yellow-green nasal discharge intermittently. Increased malaise. No fever. Using over-the-counter decongestant-phenylephrine-without improvement  Past Medical History  Diagnosis Date  . HYPERLIPIDEMIA 02/22/2008  . HYPERTENSION 02/22/2008  . Kidney stones    Past Surgical History  Procedure Laterality Date  . Tubal ligation  2006  . Induced abortion  age 41  . Kidney stone surgery  2011     [facility], right percutaneous  . Endometrial ablation    . Lap      reports that she has quit smoking. She uses smokeless tobacco. She reports that she does not drink alcohol or use illicit drugs. family history includes Cancer (age of onset: 5) in her father; Cirrhosis (age of onset: 7) in her mother. There is no history of Colon cancer, Pancreatic cancer, or Stomach cancer. Allergies  Allergen Reactions  . Varenicline Tartrate Other (See Comments)    Suicidal thoughts (Chantix)      Review of Systems  Constitutional: Negative for fever and chills.  HENT: Positive for congestion, sinus pressure and sore throat.   Respiratory: Positive for cough.   Neurological: Positive for headaches.       Objective:   Physical Exam  Constitutional: She appears well-developed and well-nourished.  HENT:  Right Ear: External ear normal.  Left Ear: External ear normal.  Mouth/Throat: Oropharynx is clear and moist.  Erythematous nasal mucosa bilaterally  Neck: Neck supple.  Cardiovascular: Normal rate and regular rhythm.     Pulmonary/Chest: Effort normal and breath sounds normal. No respiratory distress. She has no wheezes. She has no rales.          Assessment & Plan:  Recurrent versus persistent sinusitis. Levaquin 500 milligrams once daily for 10 days. Continue decongestant. Consider limited CT of sinuses if no better after the above

## 2015-10-17 NOTE — Progress Notes (Signed)
Pre visit review using our clinic review tool, if applicable. No additional management support is needed unless otherwise documented below in the visit note. 

## 2015-10-22 ENCOUNTER — Other Ambulatory Visit: Payer: Self-pay | Admitting: Family Medicine

## 2015-10-28 ENCOUNTER — Ambulatory Visit: Payer: Self-pay | Admitting: Family Medicine

## 2015-10-31 ENCOUNTER — Ambulatory Visit: Payer: Self-pay | Admitting: Family Medicine

## 2015-12-09 ENCOUNTER — Other Ambulatory Visit: Payer: Self-pay | Admitting: Family Medicine

## 2015-12-09 MED ORDER — ATORVASTATIN CALCIUM 20 MG PO TABS: 20.0000 mg | ORAL_TABLET | Freq: Every day | ORAL | Status: DC

## 2015-12-09 MED ORDER — HYDROCHLOROTHIAZIDE 12.5 MG PO CAPS: 12.5000 mg | ORAL_CAPSULE | Freq: Every day | ORAL | Status: DC

## 2015-12-09 MED ORDER — GABAPENTIN 300 MG PO CAPS: ORAL_CAPSULE | ORAL | Status: DC

## 2015-12-16 ENCOUNTER — Other Ambulatory Visit: Payer: Self-pay | Admitting: Family Medicine

## 2015-12-17 ENCOUNTER — Other Ambulatory Visit: Payer: Self-pay | Admitting: Family Medicine

## 2015-12-19 ENCOUNTER — Other Ambulatory Visit: Payer: Self-pay | Admitting: Family Medicine

## 2016-02-10 ENCOUNTER — Other Ambulatory Visit: Payer: Self-pay | Admitting: Family Medicine

## 2016-02-16 ENCOUNTER — Other Ambulatory Visit: Payer: Self-pay | Admitting: Family Medicine

## 2016-03-01 ENCOUNTER — Other Ambulatory Visit: Payer: Self-pay | Admitting: Family Medicine

## 2016-03-24 ENCOUNTER — Other Ambulatory Visit: Payer: Self-pay | Admitting: *Deleted

## 2016-03-25 ENCOUNTER — Other Ambulatory Visit: Payer: Self-pay | Admitting: General Practice

## 2016-03-25 MED ORDER — HYDROCHLOROTHIAZIDE 12.5 MG PO CAPS: 12.5000 mg | ORAL_CAPSULE | Freq: Every day | ORAL | Status: DC

## 2016-04-12 ENCOUNTER — Other Ambulatory Visit: Payer: Self-pay | Admitting: Family Medicine

## 2016-06-08 ENCOUNTER — Other Ambulatory Visit: Payer: Self-pay | Admitting: Family Medicine

## 2016-06-27 ENCOUNTER — Other Ambulatory Visit: Payer: Self-pay | Admitting: Family Medicine

## 2016-07-12 ENCOUNTER — Other Ambulatory Visit: Payer: Self-pay | Admitting: Family Medicine

## 2016-07-13 NOTE — Telephone Encounter (Signed)
Last seen 12.30.16 for acute visit. Last filled 11.14.16 for 20 tablets. Refill okay?

## 2016-07-13 NOTE — Telephone Encounter (Signed)
I do not recommend taking this prn.  If she has any anxiety or insomnia issues set up follow up to assess.

## 2016-07-19 ENCOUNTER — Encounter: Payer: Self-pay | Admitting: Family Medicine

## 2016-07-19 ENCOUNTER — Ambulatory Visit (INDEPENDENT_AMBULATORY_CARE_PROVIDER_SITE_OTHER): Payer: BLUE CROSS/BLUE SHIELD | Admitting: Family Medicine

## 2016-07-19 VITALS — BP 100/64 | HR 92 | Temp 98.3°F | Ht 61.0 in | Wt 139.9 lb

## 2016-07-19 DIAGNOSIS — E784 Other hyperlipidemia: Secondary | ICD-10-CM

## 2016-07-19 DIAGNOSIS — R7303 Prediabetes: Secondary | ICD-10-CM

## 2016-07-19 DIAGNOSIS — Z23 Encounter for immunization: Secondary | ICD-10-CM | POA: Diagnosis not present

## 2016-07-19 DIAGNOSIS — E7849 Other hyperlipidemia: Secondary | ICD-10-CM

## 2016-07-19 DIAGNOSIS — I10 Essential (primary) hypertension: Secondary | ICD-10-CM | POA: Diagnosis not present

## 2016-07-19 LAB — HEPATIC FUNCTION PANEL
ALK PHOS: 79 U/L (ref 39–117)
ALT: 19 U/L (ref 0–35)
AST: 17 U/L (ref 0–37)
Albumin: 4.1 g/dL (ref 3.5–5.2)
BILIRUBIN DIRECT: 0.1 mg/dL (ref 0.0–0.3)
BILIRUBIN TOTAL: 0.6 mg/dL (ref 0.2–1.2)
Total Protein: 6.9 g/dL (ref 6.0–8.3)

## 2016-07-19 LAB — LIPID PANEL
Cholesterol: 145 mg/dL (ref 0–200)
HDL: 36.5 mg/dL — AB (ref >39.00)
NONHDL: 108.27
TRIGLYCERIDES: 254 mg/dL — AB (ref 0.0–149.0)
Total CHOL/HDL Ratio: 4
VLDL: 50.8 mg/dL — ABNORMAL HIGH (ref 0.0–40.0)

## 2016-07-19 LAB — BASIC METABOLIC PANEL
BUN: 11 mg/dL (ref 6–23)
CALCIUM: 9.6 mg/dL (ref 8.4–10.5)
CHLORIDE: 104 meq/L (ref 96–112)
CO2: 32 meq/L (ref 19–32)
Creatinine, Ser: 0.77 mg/dL (ref 0.40–1.20)
GFR: 83.2 mL/min (ref >60.00)
Glucose, Bld: 78 mg/dL (ref 70–99)
Potassium: 3.6 mEq/L (ref 3.5–5.1)
SODIUM: 143 meq/L (ref 135–145)

## 2016-07-19 LAB — LDL CHOLESTEROL, DIRECT: Direct LDL: 74 mg/dL

## 2016-07-19 LAB — HEMOGLOBIN A1C: Hgb A1c MFr Bld: 5.9 % (ref 4.6–6.5)

## 2016-07-19 MED ORDER — ALBUTEROL SULFATE HFA 108 (90 BASE) MCG/ACT IN AERS: 2.0000 | INHALATION_SPRAY | RESPIRATORY_TRACT | 5 refills | Status: DC | PRN

## 2016-07-19 MED ORDER — CLONAZEPAM 0.5 MG PO TABS: 0.5000 mg | ORAL_TABLET | Freq: Every day | ORAL | 0 refills | Status: DC | PRN

## 2016-07-19 NOTE — Addendum Note (Signed)
Addended by: Elio Forget on: 07/19/2016 10:12 AM   Modules accepted: Orders

## 2016-07-19 NOTE — Progress Notes (Signed)
Subjective:     Patient ID: Tammy [name], female   DOB: Jan 25, 1961, 55 y.o.   MRN: [id number]  HPI Patient seen for medical follow-up. She has history of recurrent kidney stones, hypertension, hyperlipidemia, prediabetes, history of thoracic back pain which is currently stable on gabapentin. She has been able to taper back her gabapentin. Medications reviewed. Compliant with all. She's had no recent chest pains or dizziness.  Occasionally monitors blood sugar with fasting blood sugars usually around 100 or less. No polyuria or polydipsia. Needs flu vaccine.  Past Medical History:  Diagnosis Date  . HYPERLIPIDEMIA 02/22/2008  . HYPERTENSION 02/22/2008  . Kidney stones    Past Surgical History:  Procedure Laterality Date  . ENDOMETRIAL ABLATION    . INDUCED ABORTION  age 38  . [facility], right percutaneous  . lap    . TUBAL LIGATION  2006    reports that she has quit smoking. She has a 7.50 pack-year smoking history. She uses smokeless tobacco. She reports that she does not drink alcohol or use drugs. family history includes Cancer (age of onset: 16) in her father; Cirrhosis (age of onset: 11) in her mother. Allergies  Allergen Reactions  . Varenicline Tartrate Other (See Comments)    Suicidal thoughts (Chantix)     Review of Systems  Constitutional: Negative for fatigue and unexpected weight change.  Eyes: Negative for visual disturbance.  Respiratory: Negative for cough, chest tightness, shortness of breath and wheezing.   Cardiovascular: Negative for chest pain, palpitations and leg swelling.  Endocrine: Negative for polydipsia and polyuria.  Genitourinary: Negative for dysuria.  Neurological: Negative for dizziness, seizures, syncope, weakness, light-headedness and headaches.       Objective:   Physical Exam  Constitutional: She appears well-developed and well-nourished.  Eyes: Pupils are equal, round, and reactive to light.  Neck:  Neck supple. No JVD present. No thyromegaly present.  Cardiovascular: Normal rate and regular rhythm.  Exam reveals no gallop.   Pulmonary/Chest: Effort normal and breath sounds normal. No respiratory distress. She has no wheezes. She has no rales.  Musculoskeletal: She exhibits no edema.  Neurological: She is alert. No cranial nerve deficit.       Assessment:     #1 hypertension. 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

## 2016-07-19 NOTE — Addendum Note (Signed)
Addended by: Elio Forget on: 07/19/2016 10:08 AM   Modules accepted: Orders

## 2016-07-25 ENCOUNTER — Other Ambulatory Visit: Payer: Self-pay | Admitting: Family Medicine

## 2016-08-14 ENCOUNTER — Other Ambulatory Visit: Payer: Self-pay | Admitting: Family Medicine

## 2016-08-20 ENCOUNTER — Other Ambulatory Visit: Payer: Self-pay | Admitting: Family Medicine

## 2016-08-29 ENCOUNTER — Other Ambulatory Visit: Payer: Self-pay | Admitting: Family Medicine

## 2016-09-16 ENCOUNTER — Other Ambulatory Visit: Payer: Self-pay | Admitting: Family Medicine

## 2016-11-07 ENCOUNTER — Other Ambulatory Visit: Payer: Self-pay | Admitting: Family Medicine

## 2016-11-14 ENCOUNTER — Other Ambulatory Visit: Payer: Self-pay | Admitting: Family Medicine

## 2016-11-15 NOTE — Telephone Encounter (Signed)
Last OV 07-19-2016  Last refill #20, 0rf on 07-19-2016 Please advise

## 2016-11-15 NOTE — Telephone Encounter (Signed)
Refill once 

## 2016-11-16 ENCOUNTER — Encounter: Payer: Self-pay | Admitting: Internal Medicine

## 2016-11-30 ENCOUNTER — Other Ambulatory Visit: Payer: Self-pay | Admitting: Family Medicine

## 2016-12-25 ENCOUNTER — Other Ambulatory Visit: Payer: Self-pay | Admitting: Family Medicine

## 2016-12-26 ENCOUNTER — Other Ambulatory Visit: Payer: Self-pay | Admitting: Family Medicine

## 2017-01-07 ENCOUNTER — Ambulatory Visit (INDEPENDENT_AMBULATORY_CARE_PROVIDER_SITE_OTHER): Payer: BLUE CROSS/BLUE SHIELD | Admitting: Family Medicine

## 2017-01-07 VITALS — BP 112/64 | HR 83 | Temp 98.5°F | Ht 61.0 in | Wt 142.9 lb

## 2017-01-07 DIAGNOSIS — R05 Cough: Secondary | ICD-10-CM | POA: Diagnosis not present

## 2017-01-07 DIAGNOSIS — R059 Cough, unspecified: Secondary | ICD-10-CM

## 2017-01-07 MED ORDER — HYDROCODONE-HOMATROPINE 5-1.5 MG/5ML PO SYRP: 5.0000 mL | ORAL_SOLUTION | Freq: Four times a day (QID) | ORAL | 0 refills | Status: AC | PRN

## 2017-01-07 NOTE — Progress Notes (Signed)
Pre visit review using our clinic review tool, if applicable. No additional management support is needed unless otherwise documented below in the visit note. 

## 2017-01-07 NOTE — Patient Instructions (Signed)

## 2017-01-07 NOTE — Progress Notes (Signed)
Subjective:     Patient ID: Tammy [name], female   DOB: 1961-01-27, 56 y.o.   MRN: [id number]  HPI Patient is former smoker who seen with about a one and one half week history of cough occasionally productive of thick sputum. She's not had any fever. She's had some nasal congestion and occasional nosebleeds. No hemoptysis. Occasional subjective wheezing. She has albuterol inhaler which she has taken on a couple of occasions. She denies any facial pain. Occasional bifrontal headaches.  Past Medical History:  Diagnosis Date  . HYPERLIPIDEMIA 02/22/2008  . HYPERTENSION 02/22/2008  . Kidney stones    Past Surgical History:  Procedure Laterality Date  . ENDOMETRIAL ABLATION    . INDUCED ABORTION  age 58  . [facility], right percutaneous  . lap    . TUBAL LIGATION  2006    reports that she has quit smoking. She has a 7.50 pack-year smoking history. She uses smokeless tobacco. She reports that she does not drink alcohol or use drugs. family history includes Cancer (age of onset: 38) in her father; Cirrhosis (age of onset: 35) in her mother. Allergies  Allergen Reactions  . Varenicline Tartrate Other (See Comments)    Suicidal thoughts (Chantix)     Review of Systems  Constitutional: Negative for chills and fever.  HENT: Positive for congestion and sinus pressure. Negative for sore throat.   Respiratory: Positive for cough. Negative for shortness of breath.        Objective:   Physical Exam  Constitutional: She appears well-developed and well-nourished.  HENT:  Right Ear: External ear normal.  Left Ear: External ear normal.  Mouth/Throat: Oropharynx is clear and moist.  Neck: Neck supple.  Cardiovascular: Normal rate and regular rhythm.   Pulmonary/Chest: Effort normal and breath sounds normal. No respiratory distress. She has no wheezes. She has no rales.  Lymphadenopathy:    She has no cervical adenopathy.       Assessment:     Cough.  Suspect acute viral bronchitis. Patient has nonfocal exam.  No wheezing or rales noted    Plan:     -Symptomatic treatment. Hycodan cough syrup 1 teaspoon daily at bedtime for severe cough -Follow-up for any persistent or worsening symptoms  Eulas Post MD [facility] at [facility]

## 2017-01-13 ENCOUNTER — Telehealth: Payer: Self-pay | Admitting: Family Medicine

## 2017-01-13 NOTE — Telephone Encounter (Signed)
Let her know PCP out of office. Would advise ucc to evaluate to ensure lungs ok and select appropriate treatment. Thanks.

## 2017-01-13 NOTE — Telephone Encounter (Signed)
I left a detailed message with the information below at the pts cell number. 

## 2017-01-13 NOTE — Telephone Encounter (Signed)
Pt calling stating that she is not feeling any better she has heat in her cheeks and cough is worse would like to have an antibiotic.

## 2017-01-18 ENCOUNTER — Encounter: Payer: BLUE CROSS/BLUE SHIELD | Admitting: Family Medicine

## 2017-02-09 ENCOUNTER — Ambulatory Visit (INDEPENDENT_AMBULATORY_CARE_PROVIDER_SITE_OTHER): Payer: BLUE CROSS/BLUE SHIELD | Admitting: Family Medicine

## 2017-02-09 ENCOUNTER — Encounter: Payer: Self-pay | Admitting: Family Medicine

## 2017-02-09 VITALS — BP 110/80 | HR 77 | Temp 98.9°F | Wt 140.9 lb

## 2017-02-09 DIAGNOSIS — M79641 Pain in right hand: Secondary | ICD-10-CM
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
## 2017-02-09 NOTE — Patient Instructions (Signed)
Consider ice to right hand 20 minutes 3-4 times daily for any recurrent hand pain Let me know if pain not fully resolved by next week.

## 2017-02-09 NOTE — Progress Notes (Signed)
Pre visit review using our clinic review tool, if applicable. No additional management support is needed unless otherwise documented below in the visit note. 

## 2017-02-10 ENCOUNTER — Other Ambulatory Visit: Payer: Self-pay | Admitting: Family Medicine

## 2017-02-15 ENCOUNTER — Encounter: Payer: BLUE CROSS/BLUE SHIELD | Admitting: Family Medicine

## 2017-02-16 ENCOUNTER — Encounter: Payer: Self-pay | Admitting: Family Medicine

## 2017-02-16 ENCOUNTER — Ambulatory Visit (INDEPENDENT_AMBULATORY_CARE_PROVIDER_SITE_OTHER): Payer: BLUE CROSS/BLUE SHIELD | Admitting: Family Medicine

## 2017-02-16 VITALS — BP 104/60 | HR 84 | Temp 98.4°F | Ht 62.75 in | Wt 139.8 lb

## 2017-02-16 DIAGNOSIS — Z0000 Encounter for general adult medical examination without abnormal findings: Secondary | ICD-10-CM

## 2017-02-16 LAB — TSH: TSH: 1.39 u[IU]/mL (ref 0.35–4.50)

## 2017-02-16 LAB — CBC WITH DIFFERENTIAL/PLATELET
BASOS ABS: 0 10*3/uL (ref 0.0–0.1)
Basophils Relative: 0.8 % (ref 0.0–3.0)
Eosinophils Absolute: 0.1 10*3/uL (ref 0.0–0.7)
Eosinophils Relative: 2.7 % (ref 0.0–5.0)
HCT: 44.8 % (ref 36.0–46.0)
HEMOGLOBIN: 15.4 g/dL — AB (ref 12.0–15.0)
Lymphocytes Relative: 32.2 % (ref 12.0–46.0)
Lymphs Abs: 1.7 10*3/uL (ref 0.7–4.0)
MCHC: 34.5 g/dL (ref 30.0–36.0)
MCV: 99.3 fl (ref 78.0–100.0)
MONO ABS: 0.3 10*3/uL (ref 0.1–1.0)
Monocytes Relative: 5.5 % (ref 3.0–12.0)
NEUTROS PCT: 58.8 % (ref 43.0–77.0)
Neutro Abs: 3.1 10*3/uL (ref 1.4–7.7)
Platelets: 208 10*3/uL (ref 150.0–400.0)
RBC: 4.51 Mil/uL (ref 3.87–5.11)
RDW: 12.6 % (ref 11.5–15.5)
WBC: 5.3 10*3/uL (ref 4.0–10.5)

## 2017-02-16 LAB — BASIC METABOLIC PANEL
BUN: 11 mg/dL (ref 6–23)
CALCIUM: 9.7 mg/dL (ref 8.4–10.5)
CO2: 30 meq/L (ref 19–32)
Chloride: 109 mEq/L (ref 96–112)
Creatinine, Ser: 0.84 mg/dL (ref 0.40–1.20)
GFR: 75.09 mL/min (ref >60.00)
GLUCOSE: 97 mg/dL (ref 70–99)
POTASSIUM: 4.5 meq/L (ref 3.5–5.1)
Sodium: 144 mEq/L (ref 135–145)

## 2017-02-16 LAB — LIPID PANEL
CHOL/HDL RATIO: 3
Cholesterol: 131 mg/dL (ref 0–200)
HDL: 41.4 mg/dL (ref >39.00)
LDL Cholesterol: 64 mg/dL (ref 0–99)
NONHDL: 89.64
Triglycerides: 126 mg/dL (ref 0.0–149.0)
VLDL: 25.2 mg/dL (ref 0.0–40.0)

## 2017-02-16 LAB — HEPATIC FUNCTION PANEL
ALBUMIN: 4.3 g/dL (ref 3.5–5.2)
ALK PHOS: 88 U/L (ref 39–117)
ALT: 13 U/L (ref 0–35)
AST: 13 U/L (ref 0–37)
BILIRUBIN DIRECT: 0.1 mg/dL (ref 0.0–0.3)
TOTAL PROTEIN: 6.7 g/dL (ref 6.0–8.3)
Total Bilirubin: 0.5 mg/dL (ref 0.2–1.2)

## 2017-02-16 NOTE — Patient Instructions (Signed)

## 2017-02-16 NOTE — Progress Notes (Signed)
Subjective:     Patient ID: Tammy [name], female   DOB: 1961-05-19, 56 y.o.   MRN: [id number]  HPI Patient seen for physical exam. Her hand is improving from last visit. She had mammogram almost 3 years ago and has overdue for repeat colonoscopy with recommended 3 year follow-up from previous. Last Pap smear is less than 3 years ago. She has not had abnormalities. No history of HPV. Low risk. No history of hepatitis C screening.  Her chronic problems include history of hyperlipidemia, prediabetes, kidney stones, and ongoing nicotine use. Smokes about 3 cores pack cigarettes a day. No regular alcohol use.  Father died of bladder cancer. Mother died of nonalcoholic cirrhosis and possibly hepatitis C but she's not sure.  Past Medical History:  Diagnosis Date  . HYPERLIPIDEMIA 02/22/2008  . HYPERTENSION 02/22/2008  . Kidney stones    Past Surgical History:  Procedure Laterality Date  . ENDOMETRIAL ABLATION    . INDUCED ABORTION  age 43  . [facility], right percutaneous  . lap    . TUBAL LIGATION  2006    reports that she has quit smoking. She has a 7.50 pack-year smoking history. She uses smokeless tobacco. She reports that she does not drink alcohol or use drugs. family history includes Cancer (age of onset: 53) in her father; Cirrhosis (age of onset: 65) in her mother. Allergies  Allergen Reactions  . Varenicline Tartrate Other (See Comments)    Suicidal thoughts (Chantix)     Review of Systems  Constitutional: Negative for activity change, appetite change, fatigue, fever and unexpected weight change.  HENT: Negative for ear pain, hearing loss, sore throat and trouble swallowing.   Eyes: Negative for visual disturbance.  Respiratory: Negative for cough and shortness of breath.   Cardiovascular: Negative for chest pain and palpitations.  Gastrointestinal: Negative for abdominal pain, blood in stool, constipation and diarrhea.  Endocrine: Negative  for polydipsia and polyuria.  Genitourinary: Negative for dysuria and hematuria.  Musculoskeletal: Negative for arthralgias, back pain and myalgias.  Skin: Negative for rash.  Neurological: Negative for dizziness, syncope and headaches.  Hematological: Negative for adenopathy.  Psychiatric/Behavioral: Negative for confusion and dysphoric mood.       Objective:   Physical Exam  Constitutional: She is oriented to person, place, and time. She appears well-developed and well-nourished.  HENT:  Head: Normocephalic and atraumatic.  Eyes: EOM are normal. Pupils are equal, round, and reactive to light.  Neck: Normal range of motion. Neck supple. No thyromegaly present.  Cardiovascular: Normal rate, regular rhythm and normal heart sounds.   No murmur heard. Pulmonary/Chest: Breath sounds normal. No respiratory distress. She has no wheezes. She has no rales.  Abdominal: Soft. Bowel sounds are normal. She exhibits no distension and no mass. There is no tenderness. There is no rebound and no guarding.  Musculoskeletal: Normal range of motion. She exhibits no edema.  Lymphadenopathy:    She has no cervical adenopathy.  Neurological: She is alert and oriented to person, place, and time. She displays normal reflexes. No cranial nerve deficit.  Skin: No rash noted.  She has a couple benign appearing seborrheic keratosis right lower back. These are brown and scaly well-demarcated and about 5 mm diameter  Psychiatric: She has a normal mood and affect. Her behavior is normal. Judgment and thought content normal.       Assessment:     Physical exam. Patient needs repeat colonoscopy and mammogram. Ongoing  nicotine use as above    Plan:     -Patient will set of repeat mammogram and colonoscopy -She declines Pap smear but agrees to get next year which will make 3 years -Obtain screening lab work and include hepatitis C antibody -Strongly encouraged to stop smoking. Handout given  Eulas Post  MD [facility] at [facility]

## 2017-02-16 NOTE — Progress Notes (Signed)
Pre visit review using our clinic review tool, if applicable. No additional management support is needed unless otherwise documented below in the visit note. 

## 2017-02-17 ENCOUNTER — Encounter: Payer: Self-pay | Admitting: Family Medicine

## 2017-02-17 LAB — HEPATITIS C ANTIBODY: HCV AB: NEGATIVE

## 2017-02-23 ENCOUNTER — Other Ambulatory Visit: Payer: Self-pay | Admitting: Family Medicine

## 2017-03-07 ENCOUNTER — Other Ambulatory Visit: Payer: Self-pay | Admitting: Family Medicine

## 2017-03-07 NOTE — Telephone Encounter (Signed)
Last refill 11/14/16 and last office visit 02/16/17.  Okay to refill?

## 2017-03-07 NOTE — Telephone Encounter (Signed)
Recommend against prn use of benzodiazepines except in rare circumstances (eg flying, pre-procedure, etc).

## 2017-03-08 ENCOUNTER — Telehealth: Payer: Self-pay | Admitting: Family Medicine

## 2017-03-08 ENCOUNTER — Encounter: Payer: Self-pay | Admitting: Family Medicine

## 2017-03-08 ENCOUNTER — Ambulatory Visit (INDEPENDENT_AMBULATORY_CARE_PROVIDER_SITE_OTHER)
Admission: RE | Admit: 2017-03-08 | Discharge: 2017-03-08 | Disposition: A | Discharge status: Home / Self Care | Payer: BLUE CROSS/BLUE SHIELD | Source: Ambulatory Visit | Attending: Family Medicine | Admitting: Family Medicine

## 2017-03-08 ENCOUNTER — Ambulatory Visit (INDEPENDENT_AMBULATORY_CARE_PROVIDER_SITE_OTHER): Payer: BLUE CROSS/BLUE SHIELD | Admitting: Family Medicine

## 2017-03-08 VITALS — BP 102/64 | HR 62 | Temp 98.4°F | Wt 138.0 lb

## 2017-03-08 DIAGNOSIS — M79641 Pain in right hand: Secondary | ICD-10-CM

## 2017-03-08 MED ORDER — POTASSIUM CITRATE ER 10 MEQ (1080 MG) PO TBCR: EXTENDED_RELEASE_TABLET | ORAL | 1 refills | Status: DC

## 2017-03-08 NOTE — Telephone Encounter (Signed)
Pt would like you to resend her potassium citrate (UROCIT-K) 10 MEQ (1080 MG) SR tablet  To [facility] [place]  Pt states she does not use [facility]
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
## 2017-03-16 ENCOUNTER — Telehealth: Payer: Self-pay | Admitting: Family Medicine

## 2017-03-16 DIAGNOSIS — M79641 Pain in right hand: Secondary | ICD-10-CM

## 2017-03-16 NOTE — Telephone Encounter (Signed)
I called the pt and she stated Dr Elease Hashimoto told her if the x-ray was normal he would refer her to an orthopedic doctor and she has not heard from anyone.  Patient is aware Dr Elease Hashimoto is out of the office and this request was sent to another provider to address on Thursday.  Message sent to Dr Maudie Mercury.

## 2017-03-16 NOTE — Telephone Encounter (Signed)
Ok to refer per her request in PCP absence. Thanks.

## 2017-03-16 NOTE — Telephone Encounter (Signed)
Pt was seen on 03-08-17 and still waiting on a  referral to hand specialist.

## 2017-03-17 NOTE — Telephone Encounter (Signed)
Referral placed.

## 2017-03-17 NOTE — Telephone Encounter (Signed)
Referral entered and I called the pt and informed her someone will call with appt info. 

## 2017-03-22 ENCOUNTER — Ambulatory Visit (INDEPENDENT_AMBULATORY_CARE_PROVIDER_SITE_OTHER): Payer: Self-pay | Admitting: Orthopaedic Surgery

## 2017-03-28 ENCOUNTER — Encounter (INDEPENDENT_AMBULATORY_CARE_PROVIDER_SITE_OTHER): Payer: Self-pay | Admitting: Orthopaedic Surgery

## 2017-03-28 ENCOUNTER — Ambulatory Visit (INDEPENDENT_AMBULATORY_CARE_PROVIDER_SITE_OTHER): Payer: BLUE CROSS/BLUE SHIELD | Admitting: Orthopaedic Surgery

## 2017-03-28 DIAGNOSIS — M79641 Pain in right hand: Secondary | ICD-10-CM

## 2017-03-28 NOTE — Progress Notes (Signed)
Office Visit Note   Patient: Tammy [name]           Date of Birth: 1961-10-17           MRN: [id number] Visit Date: 03/28/2017              Requested by: Eulas Post, MD [place], [place] [address] PCP: Eulas Post, MD   Assessment & Plan: Visit Diagnoses:  1. Pain of right hand     Plan: Overall impression is strain of her right hand intrinsics. Recommend continue symptomatically treatment. Advil does help.  I did review her x-rays which were normal.  Follow-Up Instructions: Return if symptoms worsen or fail to improve.   Orders:  No orders of the defined types were placed in this encounter.  No orders of the defined types were placed in this encounter.     Procedures: No procedures performed   Clinical Data: No additional findings.   Subjective: Chief Complaint  Patient presents with  . Right Hand - Pain    Patient is a 56 year old female with vague right hand pain mainly over the second metacarpal since April 23 of the sheer. She states she was holding an audio recorder when he was status of her hand. Since then she has no some discomfort especially with hand washing. She denies any focal motor deficits. She does endorse some mild numbness at times. She is right-handed. She works at a a care.    Review of Systems  Constitutional: Negative.   HENT: Negative.   Eyes: Negative.   Respiratory: Negative.   Cardiovascular: Negative.   Endocrine: Negative.   Musculoskeletal: Negative.   Neurological: Negative.   Hematological: Negative.   Psychiatric/Behavioral: Negative.   All other systems reviewed and are negative.    Objective: Vital Signs: There were no vitals taken for this visit.  Physical Exam  Constitutional: She is oriented to person, place, and time. She appears well-developed and well-nourished.  HENT:  Head: Normocephalic and atraumatic.  Eyes: EOM are normal.  Neck: Neck supple.  Pulmonary/Chest:  Effort normal.  Abdominal: Soft.  Neurological: She is alert and oriented to person, place, and time.  Skin: Skin is warm. Capillary refill takes less than 2 seconds.  Psychiatric: She has a normal mood and affect. Her behavior is normal. Judgment and thought content normal.  Nursing note and vitals reviewed.   Ortho Exam Right hand exam shows no swelling or bruising. She has full function of her extensors and flexors. Her intrinsics are intact. She has a little discomfort with palpation in the second dorsal interosseous muscle. Specialty Comments:  No specialty comments available.  Imaging: No results found.   PMFS History: Patient Active Problem List   Diagnosis Date Noted  . Pain of right hand 03/28/2017  . Bilateral thoracic back pain 02/18/2015  . Prediabetes 07/11/2014  . NEPHROLITHIASIS 02/26/2010  . ANXIETY 10/10/2008  . Hyperlipidemia 02/22/2008  . TOBACCO USE 02/22/2008  . Essential hypertension 02/22/2008  . CHOLELITHIASIS, ASYMPTOMATIC 02/22/2008   Past Medical History:  Diagnosis Date  . HYPERLIPIDEMIA 02/22/2008  . HYPERTENSION 02/22/2008  . Kidney stones     Family History  Problem Relation Age of Onset  . Cirrhosis Mother 57       non alcoholic  . Cancer Father 15       bladder  . Colon cancer Neg Hx   . Pancreatic cancer Neg Hx   . Stomach cancer Neg Hx  Past Surgical History:  Procedure Laterality Date  . ENDOMETRIAL ABLATION    . INDUCED ABORTION  age 5  . [facility], right percutaneous  . lap    . TUBAL LIGATION  2006   Social History   Occupational History  . Not on file.   Social History Main Topics  . Smoking status: Former Smoker    Packs/day: 0.25    Years: 30.00  . Smokeless tobacco: Current User     Comment: E-CIG  . Alcohol use No  . Drug use: No  . Sexual activity: Not on file

## 2017-04-07 ENCOUNTER — Telehealth (INDEPENDENT_AMBULATORY_CARE_PROVIDER_SITE_OTHER): Payer: Self-pay | Admitting: Orthopaedic Surgery

## 2017-04-07 NOTE — Telephone Encounter (Signed)
03/28/2017 OV NOTE FAXED TO PATIENT [phone]

## 2017-05-23 ENCOUNTER — Other Ambulatory Visit: Payer: Self-pay | Admitting: Family Medicine

## 2017-07-07 ENCOUNTER — Encounter: Payer: Self-pay | Admitting: Family Medicine

## 2017-07-27 ENCOUNTER — Other Ambulatory Visit: Payer: Self-pay | Admitting: Family Medicine

## 2017-08-13 ENCOUNTER — Other Ambulatory Visit: Payer: Self-pay | Admitting: Family Medicine

## 2017-08-24 ENCOUNTER — Other Ambulatory Visit: Payer: Self-pay | Admitting: Family Medicine

## 2017-08-26 NOTE — Telephone Encounter (Signed)
Refill for 6 months. 

## 2017-08-26 NOTE — Telephone Encounter (Signed)
Sent to PCP for approval.  

## 2017-09-13 ENCOUNTER — Other Ambulatory Visit: Payer: Self-pay | Admitting: Family Medicine

## 2017-10-09 ENCOUNTER — Other Ambulatory Visit: Payer: Self-pay | Admitting: Family Medicine

## 2017-10-10 NOTE — Telephone Encounter (Signed)
Rx not on pts current med list-last given 11/15/2016?

## 2017-10-11 NOTE — Telephone Encounter (Signed)
She is not on Klonopin   We do not recommend prn benzodiazepines.

## 2017-10-13 NOTE — Telephone Encounter (Signed)
Rx denial sent to the pharmacy. 

## 2017-11-18 ENCOUNTER — Other Ambulatory Visit: Payer: Self-pay | Admitting: Family Medicine

## 2017-12-23 ENCOUNTER — Ambulatory Visit: Payer: BLUE CROSS/BLUE SHIELD | Admitting: Family Medicine

## 2018-01-04 ENCOUNTER — Other Ambulatory Visit: Payer: Self-pay | Admitting: Family Medicine

## 2018-01-04 NOTE — Telephone Encounter (Signed)
Refill once 

## 2018-01-05 NOTE — Telephone Encounter (Signed)
Rx done. 

## 2018-02-03 ENCOUNTER — Other Ambulatory Visit: Payer: Self-pay | Admitting: Family Medicine

## 2018-02-24 ENCOUNTER — Other Ambulatory Visit: Payer: Self-pay | Admitting: Family Medicine

## 2018-03-01 ENCOUNTER — Other Ambulatory Visit: Payer: Self-pay | Admitting: Family Medicine

## 2018-03-10 ENCOUNTER — Other Ambulatory Visit: Payer: Self-pay | Admitting: Family Medicine

## 2018-03-10 NOTE — Telephone Encounter (Signed)
Needs follow up.  Refill for one month until she can get follow up.

## 2018-03-10 NOTE — Telephone Encounter (Signed)
Last refill 09/08/17 and last office visit 03/08/17.  Okay to fill?

## 2018-03-19 ENCOUNTER — Other Ambulatory Visit: Payer: Self-pay | Admitting: Family Medicine

## 2018-04-10 ENCOUNTER — Other Ambulatory Visit: Payer: Self-pay | Admitting: Family Medicine

## 2018-05-05 ENCOUNTER — Other Ambulatory Visit: Payer: Self-pay | Admitting: Family Medicine

## 2018-05-05 ENCOUNTER — Encounter: Payer: Self-pay | Admitting: Internal Medicine

## 2018-05-05 DIAGNOSIS — Z1231 Encounter for screening mammogram for malignant neoplasm of breast: Secondary | ICD-10-CM

## 2018-05-09 ENCOUNTER — Other Ambulatory Visit: Payer: Self-pay | Admitting: Family Medicine

## 2018-05-09 NOTE — Telephone Encounter (Signed)
90 day Rx sent with no refills will need an OV for more refills

## 2018-05-12 ENCOUNTER — Encounter: Payer: Self-pay | Admitting: Family Medicine

## 2018-05-12 ENCOUNTER — Ambulatory Visit (INDEPENDENT_AMBULATORY_CARE_PROVIDER_SITE_OTHER): Payer: BLUE CROSS/BLUE SHIELD | Admitting: Family Medicine

## 2018-05-12 ENCOUNTER — Ambulatory Visit: Payer: BLUE CROSS/BLUE SHIELD | Admitting: Family Medicine

## 2018-05-12 ENCOUNTER — Other Ambulatory Visit (HOSPITAL_COMMUNITY)
Admission: RE | Admit: 2018-05-12 | Discharge: 2018-05-12 | Disposition: A | Discharge status: Home / Self Care | Payer: BLUE CROSS/BLUE SHIELD | Source: Ambulatory Visit | Attending: Family Medicine | Admitting: Family Medicine

## 2018-05-12 VITALS — BP 126/84 | HR 54 | Temp 98.3°F | Ht 62.75 in | Wt 127.0 lb

## 2018-05-12 DIAGNOSIS — Z0000 Encounter for general adult medical examination without abnormal findings: Secondary | ICD-10-CM | POA: Insufficient documentation

## 2018-05-12 LAB — CBC WITH DIFFERENTIAL/PLATELET
Basophils Absolute: 0.1 10*3/uL (ref 0.0–0.1)
Basophils Relative: 0.9 % (ref 0.0–3.0)
EOS ABS: 0.2 10*3/uL (ref 0.0–0.7)
Eosinophils Relative: 2.2 % (ref 0.0–5.0)
HCT: 45.8 % (ref 36.0–46.0)
HEMOGLOBIN: 15.8 g/dL — AB (ref 12.0–15.0)
LYMPHS ABS: 2.3 10*3/uL (ref 0.7–4.0)
Lymphocytes Relative: 32.3 % (ref 12.0–46.0)
MCHC: 34.5 g/dL (ref 30.0–36.0)
MCV: 100.8 fl — ABNORMAL HIGH (ref 78.0–100.0)
MONO ABS: 0.4 10*3/uL (ref 0.1–1.0)
MONOS PCT: 5.3 % (ref 3.0–12.0)
NEUTROS ABS: 4.1 10*3/uL (ref 1.4–7.7)
NEUTROS PCT: 59.3 % (ref 43.0–77.0)
Platelets: 210 10*3/uL (ref 150.0–400.0)
RBC: 4.54 Mil/uL (ref 3.87–5.11)
RDW: 12.8 % (ref 11.5–15.5)
WBC: 7 10*3/uL (ref 4.0–10.5)

## 2018-05-12 LAB — BASIC METABOLIC PANEL
BUN: 12 mg/dL (ref 6–23)
CALCIUM: 10.1 mg/dL (ref 8.4–10.5)
CO2: 27 meq/L (ref 19–32)
Chloride: 107 mEq/L (ref 96–112)
Creatinine, Ser: 0.96 mg/dL (ref 0.40–1.20)
GFR: 64.07 mL/min (ref >60.00)
Glucose, Bld: 82 mg/dL (ref 70–99)
POTASSIUM: 4.8 meq/L (ref 3.5–5.1)
SODIUM: 145 meq/L (ref 135–145)

## 2018-05-12 LAB — LIPID PANEL
CHOL/HDL RATIO: 4
Cholesterol: 156 mg/dL (ref 0–200)
HDL: 35.8 mg/dL — AB (ref >39.00)
LDL Cholesterol: 86 mg/dL (ref 0–99)
NonHDL: 120.04
Triglycerides: 170 mg/dL — ABNORMAL HIGH (ref 0.0–149.0)
VLDL: 34 mg/dL (ref 0.0–40.0)

## 2018-05-12 LAB — HEPATIC FUNCTION PANEL
ALBUMIN: 4.6 g/dL (ref 3.5–5.2)
ALK PHOS: 98 U/L (ref 39–117)
ALT: 17 U/L (ref 0–35)
AST: 17 U/L (ref 0–37)
BILIRUBIN TOTAL: 1 mg/dL (ref 0.2–1.2)
Bilirubin, Direct: 0.2 mg/dL (ref 0.0–0.3)
Total Protein: 7.2 g/dL (ref 6.0–8.3)

## 2018-05-12 LAB — TSH: TSH: 1.87 u[IU]/mL (ref 0.35–4.50)

## 2018-05-12 NOTE — Progress Notes (Signed)
Subjective:     Patient ID: Tammy [name], female   DOB: [birth year], 57 y.o.   MRN: [id number]  HPI Patient is here for physical exam. Long history of cigarette use. Still smokes some-one half to one pack per day. No history of shingles vaccine. She is overdue for repeat colonoscopy. Past history of adenomatous colon polyps. She is already scheduled for repeat mammogram. Overdue for Pap smear. Last tetanus 2013.  Family history unchanged.  Past Medical History:  Diagnosis Date  . HYPERLIPIDEMIA 02/22/2008  . HYPERTENSION 02/22/2008  . Kidney stones    Past Surgical History:  Procedure Laterality Date  . ENDOMETRIAL ABLATION    . INDUCED ABORTION  age 58  . [facility], right percutaneous  . lap    . TUBAL LIGATION  2006    reports that she has quit smoking. She has a 7.50 pack-year smoking history. She uses smokeless tobacco. She reports that she does not drink alcohol or use drugs. family history includes Cancer (age of onset: 89) in her father; Cirrhosis (age of onset: 71) in her mother. Allergies  Allergen Reactions  . Varenicline Tartrate Other (See Comments)    Suicidal thoughts (Chantix)     Review of Systems  Constitutional: Negative for activity change, appetite change, fatigue, fever and unexpected weight change.  HENT: Negative for ear pain, hearing loss, sore throat and trouble swallowing.   Eyes: Negative for visual disturbance.  Respiratory: Negative for cough and shortness of breath.   Cardiovascular: Negative for chest pain and palpitations.  Gastrointestinal: Negative for abdominal pain, blood in stool, constipation and diarrhea.  Genitourinary: Negative for dysuria and hematuria.  Musculoskeletal: Negative for arthralgias, back pain and myalgias.  Skin: Negative for rash.  Neurological: Negative for dizziness, syncope and headaches.  Hematological: Negative for adenopathy.  Psychiatric/Behavioral: Negative for confusion and  dysphoric mood.       Objective:   Physical Exam  Constitutional: She is oriented to person, place, and time. She appears well-developed and well-nourished.  HENT:  Head: Normocephalic and atraumatic.  Eyes: Pupils are equal, round, and reactive to light. EOM are normal.  Neck: Normal range of motion. Neck supple. No thyromegaly present.  Cardiovascular: Normal rate, regular rhythm and normal heart sounds.  No murmur heard. Pulmonary/Chest: Breath sounds normal. No respiratory distress. She has no wheezes. She has no rales.  Breasts are symmetric with no mass  Abdominal: Soft. Bowel sounds are normal. She exhibits no distension and no mass. There is no tenderness. There is no rebound and no guarding.  Genitourinary: Vagina normal and uterus normal. No vaginal discharge found.  Genitourinary Comments: Cervix normal in appearance. Bimanual exam unremarkable. Pap smear obtained  Musculoskeletal: Normal range of motion. She exhibits no edema.  Lymphadenopathy:    She has no cervical adenopathy.  Neurological: She is alert and oriented to person, place, and time. She displays normal reflexes. No cranial nerve deficit.  Skin: No rash noted.  Psychiatric: She has a normal mood and affect. Her behavior is normal. Judgment and thought content normal.       Assessment:     Physical exam. Several health maintenance issues addressed as below.  She has ongoing nicotine use but currently motivation is low to quit smoking    Plan:     -We discussed new shingles vaccine and she will check on insurance coverage -Patient in process of getting repeat colonoscopy -Repeat mammogram this year -Pap smear obtained -Obtain  lab work -Smoking cessation discussed but currently motivation is low  Eulas Post MD [facility] at [facility]

## 2018-05-12 NOTE — Addendum Note (Signed)
Addended by: Eulas Post on: 05/12/2018 09:52 AM   Modules accepted: Orders

## 2018-05-12 NOTE — Patient Instructions (Signed)
Check on coverage for new shingles vaccine (Shingrix) and let us know if interested.

## 2018-05-15 LAB — CYTOLOGY - PAP: DIAGNOSIS: NEGATIVE

## 2018-05-20 ENCOUNTER — Other Ambulatory Visit: Payer: Self-pay | Admitting: Family Medicine

## 2018-05-22 ENCOUNTER — Other Ambulatory Visit: Payer: Self-pay | Admitting: Family Medicine

## 2018-06-07 ENCOUNTER — Ambulatory Visit
Admission: RE | Admit: 2018-06-07 | Discharge: 2018-06-07 | Disposition: A | Discharge status: Home / Self Care | Payer: BLUE CROSS/BLUE SHIELD | Source: Ambulatory Visit | Attending: Family Medicine | Admitting: Family Medicine

## 2018-06-07 DIAGNOSIS — Z1231 Encounter for screening mammogram for malignant neoplasm of breast: Secondary | ICD-10-CM

## 2018-06-09 ENCOUNTER — Ambulatory Visit (AMBULATORY_SURGERY_CENTER): Payer: Self-pay | Admitting: *Deleted

## 2018-06-09 VITALS — Ht 62.0 in | Wt 136.0 lb

## 2018-06-09 DIAGNOSIS — Z86010 Personal history of colonic polyps: Secondary | ICD-10-CM

## 2018-06-09 MED ORDER — NA SULFATE-K SULFATE-MG SULF 17.5-3.13-1.6 GM/177ML PO SOLN: 1.0000 | Freq: Once | ORAL | 0 refills | Status: AC

## 2018-06-09 NOTE — Progress Notes (Signed)
Patient denies any allergies to eggs or soy. Patient denies any problems with anesthesia/sedation. Patient denies any oxygen use at home. Patient denies taking any diet/weight loss medications or blood thinners. EMMI education assisgned to patient on colonoscopy, this was explained and instructions given to patient. Suprep $50 coupon given to pt during pv.

## 2018-06-15 ENCOUNTER — Encounter: Payer: Self-pay | Admitting: Internal Medicine

## 2018-06-29 ENCOUNTER — Encounter: Payer: Self-pay | Admitting: Internal Medicine

## 2018-06-29 ENCOUNTER — Ambulatory Visit (AMBULATORY_SURGERY_CENTER): Payer: BLUE CROSS/BLUE SHIELD | Admitting: Internal Medicine

## 2018-06-29 VITALS — BP 110/55 | HR 69 | Temp 98.9°F | Resp 18 | Ht 62.0 in | Wt 136.0 lb

## 2018-06-29 DIAGNOSIS — D123 Benign neoplasm of transverse colon: Secondary | ICD-10-CM

## 2018-06-29 DIAGNOSIS — Z86010 Personal history of colonic polyps: Secondary | ICD-10-CM | POA: Diagnosis present

## 2018-06-29 MED ORDER — SODIUM CHLORIDE 0.9 % IV SOLN: 500.0000 mL | Freq: Once | INTRAVENOUS | Status: DC

## 2018-06-29 NOTE — Progress Notes (Signed)
Pt's states no medical or surgical changes since previsit or office visit. 

## 2018-06-29 NOTE — Patient Instructions (Signed)
Thank you for allowing [place] to care for you today!  Resume previous diet and medications.  Return to normal activities tomorrow.  Await pathology results, approx 2 weeks.  Handouts given for polyps, diverticulosis, and hemorrhoids.    YOU HAD AN ENDOSCOPIC PROCEDURE TODAY AT [facility]:   Refer to the procedure report that was given to you for any specific questions about what was found during the examination.  If the procedure report does not answer your questions, please call your gastroenterologist to clarify.  If you requested that your care partner not be given the details of your procedure findings, then the procedure report has been included in a sealed envelope for you to review at your convenience later.  YOU SHOULD EXPECT: Some feelings of bloating in the abdomen. Passage of more gas than usual.  Walking can help get rid of the air that was put into your GI tract during the procedure and reduce the bloating. If you had a lower endoscopy (such as a colonoscopy or flexible sigmoidoscopy) you may notice spotting of blood in your stool or on the toilet paper. If you underwent a bowel prep for your procedure, you may not have a normal bowel movement for a few days.  Please Note:  You might notice some irritation and congestion in your nose or some drainage.  This is from the oxygen used during your procedure.  There is no need for concern and it should clear up in a day or so.  SYMPTOMS TO REPORT IMMEDIATELY:   Following lower endoscopy (colonoscopy or flexible sigmoidoscopy):  Excessive amounts of blood in the stool  Significant tenderness or worsening of abdominal pains  Swelling of the abdomen that is new, acute  Fever of 100F or higher    For urgent or emergent issues, a gastroenterologist can be reached at any hour by calling [phone].   DIET:  We do recommend a small meal at first, but then you may proceed to your regular diet.  Drink plenty of fluids  but you should avoid alcoholic beverages for 24 hours.  ACTIVITY:  You should plan to take it easy for the rest of today and you should NOT DRIVE or use heavy machinery until tomorrow (because of the sedation medicines used during the test).    FOLLOW UP: Our staff will call the number listed on your records the next business day following your procedure to check on you and address any questions or concerns that you may have regarding the information given to you following your procedure. If we do not reach you, we will leave a message.  However, if you are feeling well and you are not experiencing any problems, there is no need to return our call.  We will assume that you have returned to your regular daily activities without incident.  If any biopsies were taken you will be contacted by phone or by letter within the next 1-3 weeks.  Please call us at [phone] if you have not heard about the biopsies in 3 weeks.    SIGNATURES/CONFIDENTIALITY: You and/or your care partner have signed paperwork which will be entered into your electronic medical record.  These signatures attest to the fact that that the information above on your After Visit Summary has been reviewed and is understood.  Full responsibility of the confidentiality of this discharge information lies with you and/or your care-partner.

## 2018-06-29 NOTE — Progress Notes (Signed)
Called to room to assist during endoscopic procedure.  Patient ID and intended procedure confirmed with present staff. Received instructions for my participation in the procedure from the performing physician.  

## 2018-06-29 NOTE — Op Note (Signed)
[facility] [facility] Patient Name: Tammy [name] Procedure Date: 06/29/2018 4:18 PM MRN: [id number] Endoscopist: Jerene Bears , MD Age: 57 Referring MD:  Date of Birth: 1961-06-29 Gender: Female Account #: [id number] Procedure:                Colonoscopy Indications:              High risk colon cancer surveillance: Personal                            history of colonic polyps (tubular adenoma and                            sessile serrated polyp), Last colonoscopy: March                            2015 Medicines:                Monitored Anesthesia Care Procedure:                Pre-Anesthesia Assessment:                           - Prior to the procedure, a History and Physical                            was performed, and patient medications and                            allergies were reviewed. The patient's tolerance of                            previous anesthesia was also reviewed. The risks                            and benefits of the procedure and the sedation                            options and risks were discussed with the patient.                            All questions were answered, and informed consent                            was obtained. Prior Anticoagulants: The patient has                            taken no previous anticoagulant or antiplatelet                            agents. ASA Grade Assessment: II - A patient with                            mild systemic disease. After reviewing the risks  and benefits, the patient was deemed in                            satisfactory condition to undergo the procedure.                           After obtaining informed consent, the colonoscope                            was passed under direct vision. Throughout the                            procedure, the patient's blood pressure, pulse, and                            oxygen saturations were monitored continuously. The               Colonoscope was introduced through the anus and                            advanced to the cecum, identified by appendiceal                            orifice and ileocecal valve. The colonoscopy was                            performed without difficulty. The patient tolerated                            the procedure well. The quality of the bowel                            preparation was good. The ileocecal valve,                            appendiceal orifice, and rectum were photographed. Scope In: 4:24:59 PM Scope Out: 4:38:27 PM Scope Withdrawal Time: 0 hours 10 minutes 29 seconds  Total Procedure Duration: 0 hours 13 minutes 28 seconds  Findings:                 The digital rectal exam was normal.                           Two sessile polyps were found in the transverse                            colon. The polyps were 4 to 5 mm in size. These                            polyps were removed with a cold snare. Resection                            and retrieval were complete.  Multiple small and large-mouthed diverticula were                            found in the sigmoid colon and descending colon.                           Internal hemorrhoids were found during                            retroflexion. The hemorrhoids were small. Complications:            No immediate complications. Estimated Blood Loss:     Estimated blood loss was minimal. Impression:               - Two 4 to 5 mm polyps in the transverse colon,                            removed with a cold snare. Resected and retrieved.                           - Diverticulosis in the sigmoid colon and in the                            descending colon.                           - Internal hemorrhoids. Recommendation:           - Patient has a contact number available for                            emergencies. The signs and symptoms of potential                            delayed  complications were discussed with the                            patient. Return to normal activities tomorrow.                            Written discharge instructions were provided to the                            patient.                           - Resume previous diet.                           - Continue present medications.                           - Await pathology results.                           - Repeat colonoscopy is recommended for  surveillance. The colonoscopy date will be                            determined after pathology results from today's                            exam become available for review. Jerene Bears, MD 06/29/2018 4:42:10 PM This report has been signed electronically.

## 2018-06-29 NOTE — Progress Notes (Signed)
To PACU, VSS. Report to [email or web address] 

## 2018-06-30 ENCOUNTER — Telehealth: Payer: Self-pay

## 2018-06-30 NOTE — Telephone Encounter (Signed)
Left message

## 2018-07-04 ENCOUNTER — Encounter: Payer: Self-pay | Admitting: Internal Medicine

## 2018-07-08 ENCOUNTER — Other Ambulatory Visit: Payer: Self-pay | Admitting: Family Medicine

## 2018-08-29 ENCOUNTER — Other Ambulatory Visit: Payer: Self-pay | Admitting: Family Medicine

## 2018-10-02 ENCOUNTER — Other Ambulatory Visit: Payer: Self-pay | Admitting: Family Medicine

## 2018-10-13 ENCOUNTER — Other Ambulatory Visit: Payer: Self-pay | Admitting: Family Medicine

## 2018-10-25 ENCOUNTER — Other Ambulatory Visit: Payer: Self-pay | Admitting: Family Medicine

## 2018-11-01 ENCOUNTER — Other Ambulatory Visit: Payer: Self-pay | Admitting: Family Medicine

## 2018-11-19 ENCOUNTER — Other Ambulatory Visit: Payer: Self-pay | Admitting: Family Medicine

## 2018-11-29 ENCOUNTER — Other Ambulatory Visit: Payer: Self-pay

## 2018-11-29 MED ORDER — ALBUTEROL SULFATE HFA 108 (90 BASE) MCG/ACT IN AERS: 2.0000 | INHALATION_SPRAY | RESPIRATORY_TRACT | 0 refills | Status: DC | PRN

## 2018-12-12 ENCOUNTER — Other Ambulatory Visit: Payer: Self-pay | Admitting: Family Medicine

## 2018-12-22 ENCOUNTER — Other Ambulatory Visit: Payer: Self-pay | Admitting: Family Medicine

## 2018-12-30 ENCOUNTER — Other Ambulatory Visit: Payer: Self-pay | Admitting: Family Medicine

## 2019-01-03 ENCOUNTER — Other Ambulatory Visit: Payer: Self-pay | Admitting: Family Medicine

## 2019-06-09 ENCOUNTER — Other Ambulatory Visit: Payer: Self-pay | Admitting: Family Medicine

## 2019-06-30 ENCOUNTER — Other Ambulatory Visit: Payer: Self-pay | Admitting: Family Medicine

## 2019-10-04 ENCOUNTER — Other Ambulatory Visit: Payer: Self-pay | Admitting: Family Medicine

## 2022-03-04 ENCOUNTER — Other Ambulatory Visit (HOSPITAL_BASED_OUTPATIENT_CLINIC_OR_DEPARTMENT_OTHER): Payer: Self-pay | Admitting: Family Medicine

## 2022-03-04 DIAGNOSIS — Z0289 Encounter for other administrative examinations: Secondary | ICD-10-CM

## 2022-03-04 DIAGNOSIS — Z1231 Encounter for screening mammogram for malignant neoplasm of breast: Secondary | ICD-10-CM

## 2022-03-05 ENCOUNTER — Ambulatory Visit (HOSPITAL_BASED_OUTPATIENT_CLINIC_OR_DEPARTMENT_OTHER)
Admission: RE | Admit: 2022-03-05 | Discharge: 2022-03-05 | Disposition: A | Discharge status: Home / Self Care | Payer: BC Managed Care – PPO | Source: Ambulatory Visit | Attending: Family Medicine | Admitting: Family Medicine

## 2022-03-05 DIAGNOSIS — Z1231 Encounter for screening mammogram for malignant neoplasm of breast: Secondary | ICD-10-CM | POA: Insufficient documentation

## 2022-03-05 DIAGNOSIS — Z0289 Encounter for other administrative examinations: Secondary | ICD-10-CM

## 2022-06-04 ENCOUNTER — Other Ambulatory Visit: Payer: Self-pay

## 2022-06-04 ENCOUNTER — Emergency Department (HOSPITAL_BASED_OUTPATIENT_CLINIC_OR_DEPARTMENT_OTHER)
Admission: EM | Admit: 2022-06-04 | Discharge: 2022-06-04 | Disposition: A | Discharge status: Home / Self Care | Payer: BC Managed Care – PPO | Attending: Emergency Medicine | Admitting: Emergency Medicine

## 2022-06-04 ENCOUNTER — Encounter (HOSPITAL_BASED_OUTPATIENT_CLINIC_OR_DEPARTMENT_OTHER): Payer: Self-pay

## 2022-06-04 DIAGNOSIS — R519 Headache, unspecified: Secondary | ICD-10-CM

## 2022-06-04 DIAGNOSIS — R1110 Vomiting, unspecified: Secondary | ICD-10-CM | POA: Diagnosis present

## 2022-06-04 DIAGNOSIS — K529 Noninfective gastroenteritis and colitis, unspecified: Secondary | ICD-10-CM | POA: Insufficient documentation

## 2022-06-04 DIAGNOSIS — R197 Diarrhea, unspecified: Secondary | ICD-10-CM

## 2022-06-04 LAB — CBC WITH DIFFERENTIAL/PLATELET
Abs Immature Granulocytes: 0.02 10*3/uL (ref 0.00–0.07)
Basophils Absolute: 0 10*3/uL (ref 0.0–0.1)
Basophils Relative: 0 %
Eosinophils Absolute: 0 10*3/uL (ref 0.0–0.5)
Eosinophils Relative: 0 %
HCT: 45.6 % (ref 36.0–46.0)
Hemoglobin: 16 g/dL — ABNORMAL HIGH (ref 12.0–15.0)
Immature Granulocytes: 0 %
Lymphocytes Relative: 5 %
Lymphs Abs: 0.3 10*3/uL — ABNORMAL LOW (ref 0.7–4.0)
MCH: 34.3 pg — ABNORMAL HIGH (ref 26.0–34.0)
MCHC: 35.1 g/dL (ref 30.0–36.0)
MCV: 97.9 fL (ref 80.0–100.0)
Monocytes Absolute: 0.2 10*3/uL (ref 0.1–1.0)
Monocytes Relative: 3 %
Neutro Abs: 5.4 10*3/uL (ref 1.7–7.7)
Neutrophils Relative %: 92 %
Platelets: 164 10*3/uL (ref 150–400)
RBC: 4.66 MIL/uL (ref 3.87–5.11)
RDW: 12.1 % (ref 11.5–15.5)
WBC: 5.9 10*3/uL (ref 4.0–10.5)
nRBC: 0 % (ref 0.0–0.2)

## 2022-06-04 LAB — COMPREHENSIVE METABOLIC PANEL
ALT: 15 U/L (ref 0–44)
AST: 16 U/L (ref 15–41)
Albumin: 4.7 g/dL (ref 3.5–5.0)
Alkaline Phosphatase: 85 U/L (ref 38–126)
Anion gap: 9 (ref 5–15)
BUN: 15 mg/dL (ref 6–20)
CO2: 28 mmol/L (ref 22–32)
Calcium: 10.2 mg/dL (ref 8.9–10.3)
Chloride: 105 mmol/L (ref 98–111)
Creatinine, Ser: 1.02 mg/dL — ABNORMAL HIGH (ref 0.44–1.00)
GFR, Estimated: >60 mL/min (ref >60)
Glucose, Bld: 136 mg/dL — ABNORMAL HIGH (ref 70–99)
Potassium: 4.1 mmol/L (ref 3.5–5.1)
Sodium: 142 mmol/L (ref 135–145)
Total Bilirubin: 0.9 mg/dL (ref 0.3–1.2)
Total Protein: 7.7 g/dL (ref 6.5–8.1)

## 2022-06-04 LAB — LIPASE, BLOOD: Lipase: 7 U/L — ABNORMAL LOW (ref 11–51)

## 2022-06-04 MED ORDER — PROCHLORPERAZINE EDISYLATE 10 MG/2ML IJ SOLN
10.0000 mg | Freq: Once | INTRAMUSCULAR | Status: AC
Start: 2022-06-04 — End: 2022-06-04
  Administered 2022-06-04: 10 mg via INTRAVENOUS
  Filled 2022-06-04: qty 2

## 2022-06-04 MED ORDER — ONDANSETRON HCL 4 MG/2ML IJ SOLN
4.0000 mg | Freq: Once | INTRAMUSCULAR | Status: AC
  Administered 2022-06-04: 4 mg via INTRAVENOUS
  Filled 2022-06-04: qty 2

## 2022-06-04 MED ORDER — DIPHENHYDRAMINE HCL 50 MG/ML IJ SOLN
25.0000 mg | Freq: Once | INTRAMUSCULAR | Status: AC
  Administered 2022-06-04: 25 mg via INTRAVENOUS
  Filled 2022-06-04: qty 1

## 2022-06-04 MED ORDER — SODIUM CHLORIDE 0.9 % IV BOLUS
1000.0000 mL | Freq: Once | INTRAVENOUS | Status: AC
  Administered 2022-06-04: 1000 mL via INTRAVENOUS

## 2022-06-04 MED ORDER — ONDANSETRON 4 MG PO TBDP: 4.0000 mg | ORAL_TABLET | Freq: Three times a day (TID) | ORAL | 0 refills | Status: DC | PRN

## 2022-06-04 MED ORDER — ACETAMINOPHEN 500 MG PO TABS
1000.0000 mg | ORAL_TABLET | Freq: Once | ORAL | Status: AC
  Administered 2022-06-04: 1000 mg via ORAL
  Filled 2022-06-04: qty 2

## 2022-06-04 NOTE — ED Triage Notes (Signed)
Pt BIB EMS from home with c/o N/V/D since 2100 last night. 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

## 2022-06-04 NOTE — ED Provider Notes (Signed)
[facility] DEPT  Provider Note  CSN: [id number] Arrival date & time: 06/04/22 3419  History Chief Complaint  Patient presents with   Emesis    Tammy [name] is a 61 y.o. female reports persistent vomiting and diarrhea for the last 6 hours, mostly vomiting at this point. No blood in either. She has had chills but no measured fever. She denies abdominal pain. She works in [employer] and has had several children with GI illness recently.    Home Medications Prior to Admission medications   Medication Sig Start Date End Date Taking? Authorizing Provider  ondansetron (ZOFRAN-ODT) 4 MG disintegrating tablet Take 1 tablet (4 mg total) by mouth every 8 (eight) hours as needed for nausea or vomiting. 06/04/22  Yes Truddie Hidden, MD  acyclovir (ZOVIRAX) 400 MG tablet TAKE 1 TABLET BY MOUTH TWICE A DAY Patient taking differently: TAKE 1 TABLET BY MOUTH TWICE A DAY as needed 07/28/17   Burchette, Alinda Sierras, MD  atorvastatin (LIPITOR) 20 MG tablet TAKE 1 TABLET BY MOUTH EVERY DAY 07/02/19   Burchette, Alinda Sierras, MD  Dextromethorphan-guaiFENesin (MUCINEX DM MAXIMUM STRENGTH) 60-1200 MG TB12 Take 1 tablet by mouth 2 (two) times daily.    [provider]  fexofenadine (ALLEGRA) 180 MG tablet Take 180 mg by mouth daily.    [provider]  fluticasone (FLONASE) 50 MCG/ACT nasal spray Place 1 spray into both nostrils daily as needed. Patient not taking: Reported on 06/09/2018 09/14/13   Swords, Darrick Penna, MD  gabapentin (NEURONTIN) 100 MG capsule TAKE 2 CAPSULES BY MOUTH 3 TIMES A DAY 06/11/19   Burchette, Alinda Sierras, MD  methocarbamol (ROBAXIN) 500 MG tablet TAKE 1 TABLET BY MOUTH EVERY 8 HOURS AS NEEDED FOR MUSCLE SPASMS 01/05/18   Burchette, Alinda Sierras, MD  metoprolol tartrate (LOPRESSOR) 50 MG tablet TAKE 1 TABLET BY MOUTH TWICE A DAY 03/01/18   Burchette, Alinda Sierras, MD  metoprolol tartrate (LOPRESSOR) 50 MG tablet TAKE 1 TABLET BY MOUTH TWICE A DAY 01/03/19   Burchette,  Alinda Sierras, MD  NON FORMULARY 250 mg daily.    [provider]  potassium citrate (UROCIT-K) 10 MEQ (1080 MG) SR tablet TAKE 1 TABLET BY MOUTH 3 TIMES A DAY WITH MEALS 10/25/18   Burchette, Alinda Sierras, MD  PROAIR HFA 108 (90 Base) MCG/ACT inhaler INHALE 2 PUFFS INTO THE LUNGS EVERY 4 HOURS AS NEEDED FOR WHEEZING OR SHORTNESS OF BREATH. 12/22/18   Burchette, Alinda Sierras, MD     Allergies    Varenicline tartrate   Review of Systems   Review of Systems Please see HPI for pertinent positives and negatives  Physical Exam BP 124/69   Pulse (!) 102   Temp 100.2 F (37.9 C)   Resp 18   Ht '5\' 2"'$  (1.575 m)   Wt 62 kg   SpO2 96%   BMI 25.00 kg/m   Physical Exam Vitals and nursing note reviewed.  Constitutional:      Appearance: Normal appearance.  HENT:     Head: Normocephalic and atraumatic.     Nose: Nose normal.     Mouth/Throat:     Mouth: Mucous membranes are dry.  Eyes:     Extraocular Movements: Extraocular movements intact.     Conjunctiva/sclera: Conjunctivae normal.  Cardiovascular:     Rate and Rhythm: Normal rate.  Pulmonary:     Effort: Pulmonary effort is normal.     Breath sounds: Normal breath sounds.  Abdominal:  General: Abdomen is flat.     Palpations: Abdomen is soft.     Tenderness: There is no abdominal tenderness. There is no guarding.  Musculoskeletal:        General: No swelling. Normal range of motion.     Cervical back: Neck supple.  Skin:    General: Skin is warm and dry.     Coloration: Skin is pale.  Neurological:     General: No focal deficit present.     Mental Status: She is alert.  Psychiatric:        Mood and Affect: Mood normal.     ED Results / Procedures / Treatments   EKG None  Procedures Procedures  Medications Ordered in the ED Medications  sodium chloride 0.9 % bolus 1,000 mL (has no administration in time range)  ondansetron (ZOFRAN) injection 4 mg (4 mg Intravenous Given 06/04/22 0540)  sodium chloride 0.9 % bolus  1,000 mL (1,000 mLs Intravenous New Bag/Given 06/04/22 0542)    Initial Impression and Plan  Patient here with N/V/D. Appears dehydrated, but abdomen is benign. Will check labs, give zofran and IVF for symptom management.   ED Course   Clinical Course as of 06/04/22 0640  Fri Jun 04, 2022  0544 CBC is unremarkable.  [CS]  8850 CMP and lipase are unremarkable.  [CS]  [phone] Patient is resting comfortably, sleeping soundly. Will give a second bag of IVF then plan PO trial. Care will be signed out to the oncoming team at the change of shift.  [CS]    Clinical Course User Index [CS] Truddie Hidden, MD     MDM Rules/Calculators/A&P Medical Decision Making Problems Addressed: Gastroenteritis: acute illness or injury  Amount and/or Complexity of Data Reviewed Labs: ordered. Decision-making details documented in ED Course.  Risk Prescription drug management.    Final Clinical Impression(s) / ED Diagnoses Final diagnoses:  Gastroenteritis    Rx / DC Orders ED Discharge Orders          Ordered    ondansetron (ZOFRAN-ODT) 4 MG disintegrating tablet  Every 8 hours PRN        06/04/22 0639             Truddie Hidden, MD 06/04/22 [phone]

## 2022-06-04 NOTE — ED Provider Notes (Signed)
Past Medical History:  Diagnosis Date   Allergy    History of angina 2006   HYPERLIPIDEMIA 02/22/2008   HYPERTENSION 02/22/2008   Kidney stones    Smoker     Physical Exam  BP 124/69   Pulse (!) 102   Temp 100.2 F (37.9 C)   Resp 18   Ht '5\' 2"'$  (1.575 m)   Wt 62 kg   SpO2 96%   BMI 25.00 kg/m   Physical Exam  Procedures  Procedures  ED Course / MDM   Clinical Course as of 06/04/22 0744  Fri Jun 04, 2022  0544 CBC is unremarkable.  [CS]  2481 CMP and lipase are unremarkable.  [CS]  [phone] Patient is resting comfortably, sleeping soundly. Will give a second bag of IVF then plan PO trial. Care will be signed out to the oncoming team at the change of shift.  [CS]    Clinical Course User Index [CS] Truddie Hidden, MD   Medical Decision Making Amount and/or Complexity of Data Reviewed Labs: ordered.  Risk OTC drugs. Prescription drug management.   61yo female with history of htn, hlpd, presents with concern for nausea, vomiting and diarrhea.  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  Abdominal exam is benign, doubt sBO, appendicitis, diverticulitis, cholecystitis.  Elevated temperature with clinical picture consistent with viral gastroenteritis. On my hx does note hx of migraines and headache that now feels similar. Denies numbness, weakness, difficulty talking, visual changes or facial droop.  Given presence of primary n/v/d, normal mental status doubt meningitis, RMSF.  Labs reviewed and personally interpreted by me show no significant electrolyte abnormalities, no sign of pancreatitis, no leukocytosis.  Given Compazine, Benadryl and Tylenol.  On reevaluation has improved, is tolerating po.  Discussed return precautions. Given zofran rx. Patient discharged in stable condition with understanding of reasons to return.        Gareth Morgan, MD 06/04/22 [phone]

## 2023-02-13 ENCOUNTER — Other Ambulatory Visit: Payer: Self-pay

## 2023-02-13 ENCOUNTER — Emergency Department (HOSPITAL_BASED_OUTPATIENT_CLINIC_OR_DEPARTMENT_OTHER): Payer: BC Managed Care – PPO

## 2023-02-13 ENCOUNTER — Emergency Department (HOSPITAL_BASED_OUTPATIENT_CLINIC_OR_DEPARTMENT_OTHER)
Admission: EM | Admit: 2023-02-13 | Discharge: 2023-02-13 | Disposition: A | Discharge status: Home / Self Care | Payer: BC Managed Care – PPO | Attending: Emergency Medicine | Admitting: Emergency Medicine

## 2023-02-13 DIAGNOSIS — Z79899 Other long term (current) drug therapy: Secondary | ICD-10-CM | POA: Diagnosis not present

## 2023-02-13 DIAGNOSIS — R1010 Upper abdominal pain, unspecified: Secondary | ICD-10-CM | POA: Diagnosis present

## 2023-02-13 DIAGNOSIS — K8020 Calculus of gallbladder without cholecystitis without obstruction: Secondary | ICD-10-CM | POA: Insufficient documentation

## 2023-02-13 DIAGNOSIS — I10 Essential (primary) hypertension: Secondary | ICD-10-CM | POA: Insufficient documentation

## 2023-02-13 LAB — COMPREHENSIVE METABOLIC PANEL
ALT: 15 U/L (ref 0–44)
AST: 16 U/L (ref 15–41)
Albumin: 4.6 g/dL (ref 3.5–5.0)
Alkaline Phosphatase: 87 U/L (ref 38–126)
Anion gap: 7 (ref 5–15)
BUN: 17 mg/dL (ref 6–20)
CO2: 31 mmol/L (ref 22–32)
Calcium: 10.2 mg/dL (ref 8.9–10.3)
Chloride: 104 mmol/L (ref 98–111)
Creatinine, Ser: 0.79 mg/dL (ref 0.44–1.00)
GFR, Estimated: >60 mL/min (ref >60)
Glucose, Bld: 102 mg/dL — ABNORMAL HIGH (ref 70–99)
Potassium: 4.1 mmol/L (ref 3.5–5.1)
Sodium: 142 mmol/L (ref 135–145)
Total Bilirubin: 0.5 mg/dL (ref 0.3–1.2)
Total Protein: 7.3 g/dL (ref 6.5–8.1)

## 2023-02-13 LAB — URINALYSIS, ROUTINE W REFLEX MICROSCOPIC
Bilirubin Urine: NEGATIVE
Glucose, UA: NEGATIVE mg/dL
Hgb urine dipstick: NEGATIVE
Ketones, ur: NEGATIVE mg/dL
Leukocytes,Ua: NEGATIVE
Nitrite: NEGATIVE
Protein, ur: NEGATIVE mg/dL
Specific Gravity, Urine: 1.014 (ref 1.005–1.030)
pH: 7.5 (ref 5.0–8.0)

## 2023-02-13 LAB — CBC WITH DIFFERENTIAL/PLATELET
Abs Immature Granulocytes: 0.03 10*3/uL (ref 0.00–0.07)
Basophils Absolute: 0 10*3/uL (ref 0.0–0.1)
Basophils Relative: 0 %
Eosinophils Absolute: 0.2 10*3/uL (ref 0.0–0.5)
Eosinophils Relative: 2 %
HCT: 46.3 % — ABNORMAL HIGH (ref 36.0–46.0)
Hemoglobin: 16.1 g/dL — ABNORMAL HIGH (ref 12.0–15.0)
Immature Granulocytes: 0 %
Lymphocytes Relative: 30 %
Lymphs Abs: 2.7 10*3/uL (ref 0.7–4.0)
MCH: 33.5 pg (ref 26.0–34.0)
MCHC: 34.8 g/dL (ref 30.0–36.0)
MCV: 96.5 fL (ref 80.0–100.0)
Monocytes Absolute: 0.6 10*3/uL (ref 0.1–1.0)
Monocytes Relative: 6 %
Neutro Abs: 5.5 10*3/uL (ref 1.7–7.7)
Neutrophils Relative %: 62 %
Platelets: 222 10*3/uL (ref 150–400)
RBC: 4.8 MIL/uL (ref 3.87–5.11)
RDW: 11.9 % (ref 11.5–15.5)
WBC: 9.1 10*3/uL (ref 4.0–10.5)
nRBC: 0 % (ref 0.0–0.2)

## 2023-02-13 LAB — LIPASE, BLOOD: Lipase: 24 U/L (ref 11–51)

## 2023-02-13 MED ORDER — PANTOPRAZOLE SODIUM 20 MG PO TBEC: 20.0000 mg | DELAYED_RELEASE_TABLET | Freq: Every day | ORAL | 0 refills | Status: DC

## 2023-02-13 MED ORDER — FENTANYL CITRATE PF 50 MCG/ML IJ SOSY
50.0000 ug | PREFILLED_SYRINGE | Freq: Once | INTRAMUSCULAR | Status: AC
  Administered 2023-02-13: 50 ug via INTRAVENOUS
  Filled 2023-02-13: qty 1

## 2023-02-13 MED ORDER — SODIUM CHLORIDE 0.9 % IV BOLUS
1000.0000 mL | Freq: Once | INTRAVENOUS | Status: AC
  Administered 2023-02-13: 1000 mL via INTRAVENOUS

## 2023-02-13 MED ORDER — ONDANSETRON 4 MG PO TBDP: 4.0000 mg | ORAL_TABLET | Freq: Three times a day (TID) | ORAL | 0 refills | Status: DC | PRN

## 2023-02-13 MED ORDER — OXYCODONE HCL 5 MG PO TABS: 5.0000 mg | ORAL_TABLET | Freq: Four times a day (QID) | ORAL | 0 refills | Status: DC | PRN

## 2023-02-13 MED ORDER — ONDANSETRON HCL 4 MG/2ML IJ SOLN
4.0000 mg | Freq: Once | INTRAMUSCULAR | Status: AC
  Administered 2023-02-13: 4 mg via INTRAVENOUS
  Filled 2023-02-13: qty 2

## 2023-02-13 NOTE — ED Provider Notes (Signed)
[facility] EMERGENCY DEPARTMENT AT [facility] Provider Note   CSN: [id number] Arrival date & time: 02/13/23  1924     History  Chief Complaint  Patient presents with   Abdominal Pain    Tammy [name] is a 62 y.o. female.  Patient here with upper abdominal pain for the last couple hours.  Nausea but no vomiting.  History of kidney stones and gallstones.  No pain with urination.  Nothing makes it worse or better.  Denies any chest pain or shortness of breath.  No fever or chills.  She felt like she was going to have diarrhea but she did not.  History of hypertension high cholesterol otherwise.  The history is provided by the patient.       Home Medications Prior to Admission medications   Medication Sig Start Date End Date Taking? Authorizing Provider  ondansetron (ZOFRAN-ODT) 4 MG disintegrating tablet Take 1 tablet (4 mg total) by mouth every 8 (eight) hours as needed for nausea or vomiting. 02/13/23  Yes Brynnly Bonet, DO  oxyCODONE (ROXICODONE) 5 MG immediate release tablet Take 1 tablet (5 mg total) by mouth every 6 (six) hours as needed for up to 10 doses for breakthrough pain. 02/13/23  Yes Jodi Kappes, DO  pantoprazole (PROTONIX) 20 MG tablet Take 1 tablet (20 mg total) by mouth daily for 14 days. 02/13/23 02/27/23 Yes Odette Watanabe, DO  acyclovir (ZOVIRAX) 400 MG tablet TAKE 1 TABLET BY MOUTH TWICE A DAY Patient taking differently: TAKE 1 TABLET BY MOUTH TWICE A DAY as needed 07/28/17   Burchette, Elberta Fortis, MD  atorvastatin (LIPITOR) 20 MG tablet TAKE 1 TABLET BY MOUTH EVERY DAY 07/02/19   Burchette, Elberta Fortis, MD  buPROPion (WELLBUTRIN XL) 300 MG 24 hr tablet Take 300 mg by mouth daily. 04/15/22   [provider]  Dextromethorphan-guaiFENesin (MUCINEX DM MAXIMUM STRENGTH) 60-1200 MG TB12 Take 1 tablet by mouth 2 (two) times daily.    [provider]  fexofenadine (ALLEGRA) 180 MG tablet Take 180 mg by mouth daily.    [provider]   fluticasone (FLONASE) 50 MCG/ACT nasal spray Place 1 spray into both nostrils daily as needed. Patient not taking: Reported on 06/09/2018 09/14/13   Swords, Valetta Mole, MD  gabapentin (NEURONTIN) 100 MG capsule TAKE 2 CAPSULES BY MOUTH 3 TIMES A DAY 06/11/19   Burchette, Elberta Fortis, MD  methocarbamol (ROBAXIN) 500 MG tablet TAKE 1 TABLET BY MOUTH EVERY 8 HOURS AS NEEDED FOR MUSCLE SPASMS 01/05/18   Burchette, Elberta Fortis, MD  metoprolol tartrate (LOPRESSOR) 50 MG tablet TAKE 1 TABLET BY MOUTH TWICE A DAY 03/01/18   Burchette, Elberta Fortis, MD  metoprolol tartrate (LOPRESSOR) 50 MG tablet TAKE 1 TABLET BY MOUTH TWICE A DAY 01/03/19   Burchette, Elberta Fortis, MD  NON FORMULARY 250 mg daily.    [provider]  potassium citrate (UROCIT-K) 10 MEQ (1080 MG) SR tablet TAKE 1 TABLET BY MOUTH 3 TIMES A DAY WITH MEALS 10/25/18   Burchette, Elberta Fortis, MD  PROAIR HFA 108 [phone] Base) MCG/ACT inhaler INHALE 2 PUFFS INTO THE LUNGS EVERY 4 HOURS AS NEEDED FOR WHEEZING OR SHORTNESS OF BREATH. 12/22/18   Burchette, Elberta Fortis, MD      Allergies    Varenicline tartrate    Review of Systems   Review of Systems  Physical Exam Updated Vital Signs BP (!) 129/50   Pulse 70   Temp 98.4 F (36.9 C) (Oral)   Resp 17  Ht 5\' 2"  (1.575 m)   Wt 62 kg   SpO2 94%   BMI 25.00 kg/m  Physical Exam Vitals and nursing note reviewed.  Constitutional:      General: She is not in acute distress.    Appearance: She is well-developed.  HENT:     Head: Normocephalic and atraumatic.     Mouth/Throat:     Mouth: Mucous membranes are moist.  Eyes:     Extraocular Movements: Extraocular movements intact.     Conjunctiva/sclera: Conjunctivae normal.  Cardiovascular:     Rate and Rhythm: Normal rate and regular rhythm.     Heart sounds: Normal heart sounds. No murmur heard. Pulmonary:     Effort: Pulmonary effort is normal. No respiratory distress.     Breath sounds: Normal breath sounds.  Abdominal:     Palpations: Abdomen is soft.      Tenderness: There is abdominal tenderness in the right upper quadrant and epigastric area. There is right CVA tenderness.  Musculoskeletal:        General: No swelling.     Cervical back: Neck supple.  Skin:    General: Skin is warm and dry.     Capillary Refill: Capillary refill takes less than 2 seconds.  Neurological:     Mental Status: She is alert.  Psychiatric:        Mood and Affect: Mood normal.     ED Results / Procedures / Treatments   Labs (all labs ordered are listed, but only abnormal results are displayed) Labs Reviewed  CBC WITH DIFFERENTIAL/PLATELET - Abnormal; Notable for the following components:      Result Value   Hemoglobin 16.1 (*)    HCT 46.3 (*)    All other components within normal limits  COMPREHENSIVE METABOLIC PANEL - Abnormal; Notable for the following components:   Glucose, Bld 102 (*)    All other components within normal limits  URINALYSIS, ROUTINE W REFLEX MICROSCOPIC - Abnormal; Notable for the following components:   APPearance HAZY (*)    Bacteria, UA RARE (*)    All other components within normal limits  LIPASE, BLOOD    EKG None  Radiology CT Renal Stone Study  Result Date: 02/13/2023 CLINICAL DATA:  Abdominal and flank pain EXAM: CT ABDOMEN AND PELVIS WITHOUT CONTRAST TECHNIQUE: Multidetector CT imaging of the abdomen and pelvis was performed following the standard protocol without IV contrast. RADIATION DOSE REDUCTION: This exam was performed according to the departmental dose-optimization program which includes automated exposure control, adjustment of the mA and/or kV according to patient size and/or use of iterative reconstruction technique. COMPARISON:  CT abdomen and pelvis 10/14/2015 FINDINGS: Lower chest: No acute abnormality. Hepatobiliary: Gallstones are present. There is no pericholecystic inflammation or biliary ductal dilatation. The liver is within normal limits. Pancreas: Unremarkable. No pancreatic ductal dilatation or  surrounding inflammatory changes. Spleen: Calcified granulomas are present. Adrenals/Urinary Tract: There is a single punctate nonobstructing left renal calculus. Otherwise, the kidneys, adrenal glands and bladder are within normal limits. Stomach/Bowel: Stomach is within normal limits. Appendix appears normal. No evidence of bowel wall thickening, distention, or inflammatory changes. There is sigmoid colon diverticulosis. Vascular/Lymphatic: Aortic atherosclerosis. No enlarged abdominal or pelvic lymph nodes. Reproductive: Uterus and bilateral adnexa are unremarkable. Other: No abdominal wall hernia or abnormality. No abdominopelvic ascites. Musculoskeletal: No acute or significant osseous findings. IMPRESSION: 1. No acute localizing process in the abdomen or pelvis. 2. Cholelithiasis. 3. Nonobstructing left renal calculus. 4. Sigmoid colon diverticulosis. Aortic Atherosclerosis (  ICD10-I70.0). Electronically Signed   By: Darliss Cheney M.D.   On: 02/13/2023 20:59    Procedures Procedures    Medications Ordered in ED Medications  sodium chloride 0.9 % bolus 1,000 mL (1,000 mLs Intravenous New Bag/Given 02/13/23 2103)  ondansetron [facility]) injection 4 mg (4 mg Intravenous Given 02/13/23 2103)  fentaNYL (SUBLIMAZE) injection 50 mcg (50 mcg Intravenous Given 02/13/23 2103)    ED Course/ Medical Decision Making/ A&P                             Medical Decision Making Amount and/or Complexity of Data Reviewed Labs: ordered. Radiology: ordered.  Risk Prescription drug management.   Tammy [name] is here with abdominal pain, history of kidney stones, hypertension, high cholesterol.  Normal vitals.  No fever.  Differential diagnosis pancreatitis versus cholecystitis versus bowel obstruction versus kidney stone.  Enteritis/gastritis possible also.  She has no chest pain or shortness of breath and doubt cardiac and pulmonary process.  Will get CBC, CMP, lipase, urinalysis and CT scan abdomen pelvis.   Will give IV fluids, IV fentanyl, IV Zofran IV fluids and reevaluate.  Per review interpretation labs no significant anemia, electrolyte abnormality, kidney injury or leukocytosis.  CT scan is unremarkable except for gallstones but no signs of cholecystitis or pancreatitis.  This could be gallbladder related or could be stomach related.  Could be viral process.  She is feeling much better.  Will prescribe her Protonix, Zofran, oxycodone for breakthrough pain.  Will have her follow-up with general surgery regarding gallstones.  Discharged in good condition.  This chart was dictated using voice recognition software.  Despite best efforts to proofread,  errors can occur which can change the documentation meaning.         Final Clinical Impression(s) / ED Diagnoses Final diagnoses:  Calculus of gallbladder without cholecystitis without obstruction    Rx / DC Orders ED Discharge Orders          Ordered    oxyCODONE (ROXICODONE) 5 MG immediate release tablet  Every 6 hours PRN        02/13/23 2150    ondansetron (ZOFRAN-ODT) 4 MG disintegrating tablet  Every 8 hours PRN        02/13/23 2150    pantoprazole (PROTONIX) 20 MG tablet  Daily        02/13/23 2150              Virgina Norfolk, DO 02/13/23 2151

## 2023-02-13 NOTE — ED Triage Notes (Addendum)
Pt via pov from home with epigastric and lower abdominal pain since 1PM. Pt reports that she had a "gallstone attack" several years ago and this feels similar. Pt endorses nausea and "very little vomiting." Denies hematuria; endorses frequency with little output.  Pt has extensive hx of kidney stones.Pt alert  oriented, nad noted.

## 2023-02-13 NOTE — Discharge Instructions (Signed)
Follow-up with general surgery.  Try to avoid fatty and protein related foods the best possible.  Take Zofran as needed for nausea vomiting.  I have written you for Roxicodone for breakthrough pain.  This medicine is a narcotic pain medicine so do not use with alcohol or drugs or dangerous activities including driving.  I have also started you on acid reflux medicine called Protonix.

## 2023-09-07 ENCOUNTER — Encounter: Payer: Self-pay | Admitting: Internal Medicine

## 2023-11-05 ENCOUNTER — Emergency Department (HOSPITAL_BASED_OUTPATIENT_CLINIC_OR_DEPARTMENT_OTHER)
Admission: EM | Admit: 2023-11-05 | Discharge: 2023-11-05 | Disposition: A | Discharge status: Home / Self Care | Payer: BC Managed Care – PPO | Attending: Emergency Medicine | Admitting: Emergency Medicine

## 2023-11-05 ENCOUNTER — Encounter (HOSPITAL_BASED_OUTPATIENT_CLINIC_OR_DEPARTMENT_OTHER): Payer: Self-pay

## 2023-11-05 DIAGNOSIS — M545 Low back pain, unspecified: Secondary | ICD-10-CM | POA: Diagnosis present

## 2023-11-05 DIAGNOSIS — I10 Essential (primary) hypertension: Secondary | ICD-10-CM | POA: Diagnosis not present

## 2023-11-05 DIAGNOSIS — Z79899 Other long term (current) drug therapy: Secondary | ICD-10-CM | POA: Insufficient documentation

## 2023-11-05 DIAGNOSIS — R110 Nausea: Secondary | ICD-10-CM | POA: Insufficient documentation

## 2023-11-05 LAB — URINALYSIS, ROUTINE W REFLEX MICROSCOPIC
Bacteria, UA: NONE SEEN
Bilirubin Urine: NEGATIVE
Glucose, UA: NEGATIVE mg/dL
Hgb urine dipstick: NEGATIVE
Ketones, ur: NEGATIVE mg/dL
Nitrite: NEGATIVE
Specific Gravity, Urine: 1.032 — ABNORMAL HIGH (ref 1.005–1.030)
pH: 5.5 (ref 5.0–8.0)

## 2023-11-05 MED ORDER — OXYCODONE-ACETAMINOPHEN 5-325 MG PO TABS
1.0000 | ORAL_TABLET | ORAL | Status: DC | PRN
  Administered 2023-11-05: 1 via ORAL
  Filled 2023-11-05: qty 1

## 2023-11-05 MED ORDER — HYDROCODONE-ACETAMINOPHEN 5-325 MG PO TABS: 1.0000 | ORAL_TABLET | Freq: Four times a day (QID) | ORAL | 0 refills | Status: DC | PRN

## 2023-11-05 MED ORDER — LORAZEPAM 1 MG PO TABS
0.5000 mg | ORAL_TABLET | Freq: Once | ORAL | Status: AC
Start: 2023-11-05 — End: 2023-11-05
  Administered 2023-11-05: 0.5 mg via ORAL
  Filled 2023-11-05: qty 1

## 2023-11-05 MED ORDER — CYCLOBENZAPRINE HCL 10 MG PO TABS: 10.0000 mg | ORAL_TABLET | Freq: Two times a day (BID) | ORAL | 0 refills | Status: DC | PRN

## 2023-11-05 NOTE — Discharge Instructions (Signed)
You have been seen today for your complaint of right-sided low back pain and muscle spasms. Your lab work was negative for infection. Your discharge medications include Flexeril. This is a muscle relaxer. It may cause drowsiness. Do not drive, operate heavy machinery or make important decisions when taking this medication. Only take it at night until you know how it affects you. Only take it as needed and take other medications such as ibuprofen or tylenol prior to trying this medication.   Vicodin. This is an opioid pain medication. You should only take this medication as needed for severe pain. You should not drive, operate heavy machinery or make important decisions while taking this medication. You should use alternative methods for pain relief while taking this medication including stretching, gentle range of motion, and alternating tylenol and ibuprofen. Follow up with: Your orthopedic doctor Please seek immediate medical care if you develop any of the following symptoms: You develop new bowel or bladder control problems. You have unusual weakness or numbness in your arms or legs. You feel faint. At this time there does not appear to be the presence of an emergent medical condition, however there is always the potential for conditions to change. Please read and follow the below instructions.  Do not take your medicine if  develop an itchy rash, swelling in your mouth or lips, or difficulty breathing; call 911 and seek immediate emergency medical attention if this occurs.  You may review your lab tests and imaging results in their entirety on your MyChart account.  Please discuss all results of fully with your primary care provider and other specialist at your follow-up visit.  Note: Portions of this text may have been transcribed using voice recognition software. Every effort was made to ensure accuracy; however, inadvertent computerized transcription errors may still be present.

## 2023-11-05 NOTE — ED Provider Notes (Signed)
[place] [facility]ERGENCY DEPARTMENT AT [facility] Provider Note   CSN: [id number] Arrival date & time: 11/05/23  1059     History  Chief Complaint  Patient presents with   Back Pain    Tammy [name] is a 63 y.o. female.  With a history of hypertension, hyperlipidemia, kidney stones presenting to the ED for evaluation of back pain.  Symptoms began approximately 10 days ago.  Tammy [name] states Tammy [name] was sitting up when Tammy [name] felt an odd sensation in her back.  Tammy [name] follows with [facility] and had an appointment 3 days ago.  Tammy [name] states Tammy [name] had x-rays done at that time and was started on methocarbamol and prednisone for symptoms.  Tammy [name] states the methocarbamol has not been working.  Tammy [name] took a dose of Flexeril last night which did improve her symptoms some, however the symptoms returned and were worse this morning.  Pain is localized to the right mid and low back.  Described as a sharp and spasming type pain.  It is worse with any movement.  Pain does not radiate down the leg.  No lower extremity numbness, weakness or tingling.  No saddle anesthesia.  No urinary or fecal incontinence.  No fevers, chills or history of injection drug use.  Tammy [name] denies any abdominal pain.  Does have a history of kidney stones.  Denies dysuria, frequency, urgency, hematuria.  Reports some nausea secondary to the pain but no vomiting.   Back Pain      Home Medications Prior to Admission medications   Medication Sig Start Date End Date Taking? Authorizing Provider  cyclobenzaprine (FLEXERIL) 10 MG tablet Take 1 tablet (10 mg total) by mouth 2 (two) times daily as needed for muscle spasms. 11/05/23  Yes Ahyana Skillin, Edsel Petrin, PA-C  HYDROcodone-acetaminophen (NORCO/VICODIN) 5-325 MG tablet Take 1 tablet by mouth every 6 (six) hours as needed. 11/05/23  Yes Kalea Perine, Edsel Petrin, PA-C  acyclovir (ZOVIRAX) 400 MG tablet TAKE 1 TABLET BY MOUTH TWICE A DAY Patient taking differently: TAKE 1 TABLET BY MOUTH TWICE A DAY as needed  07/28/17   Burchette, Elberta Fortis, MD  atorvastatin (LIPITOR) 20 MG tablet TAKE 1 TABLET BY MOUTH EVERY DAY 07/02/19   Burchette, Elberta Fortis, MD  buPROPion (WELLBUTRIN XL) 300 MG 24 hr tablet Take 300 mg by mouth daily. 04/15/22   [provider]  Dextromethorphan-guaiFENesin (MUCINEX DM MAXIMUM STRENGTH) 60-1200 MG TB12 Take 1 tablet by mouth 2 (two) times daily.    [provider]  fexofenadine (ALLEGRA) 180 MG tablet Take 180 mg by mouth daily.    [provider]  fluticasone (FLONASE) 50 MCG/ACT nasal spray Place 1 spray into both nostrils daily as needed. Patient not taking: Reported on 06/09/2018 09/14/13   Swords, Valetta Mole, MD  gabapentin (NEURONTIN) 100 MG capsule TAKE 2 CAPSULES BY MOUTH 3 TIMES A DAY 06/11/19   Burchette, Elberta Fortis, MD  methocarbamol (ROBAXIN) 500 MG tablet TAKE 1 TABLET BY MOUTH EVERY 8 HOURS AS NEEDED FOR MUSCLE SPASMS 01/05/18   Burchette, Elberta Fortis, MD  metoprolol tartrate (LOPRESSOR) 50 MG tablet TAKE 1 TABLET BY MOUTH TWICE A DAY 03/01/18   Burchette, Elberta Fortis, MD  metoprolol tartrate (LOPRESSOR) 50 MG tablet TAKE 1 TABLET BY MOUTH TWICE A DAY 01/03/19   Burchette, Elberta Fortis, MD  NON FORMULARY 250 mg daily.    [provider]  ondansetron (ZOFRAN-ODT) 4 MG disintegrating tablet Take 1 tablet (4 mg total) by mouth every 8 (eight) hours as needed for nausea  or vomiting. 02/13/23   Curatolo, Adam, DO  oxyCODONE (ROXICODONE) 5 MG immediate release tablet Take 1 tablet (5 mg total) by mouth every 6 (six) hours as needed for up to 10 doses for breakthrough pain. 02/13/23   Curatolo, Adam, DO  pantoprazole (PROTONIX) 20 MG tablet Take 1 tablet (20 mg total) by mouth daily for 14 days. 02/13/23 02/27/23  Curatolo, Adam, DO  potassium citrate (UROCIT-K) 10 MEQ (1080 MG) SR tablet TAKE 1 TABLET BY MOUTH 3 TIMES A DAY WITH MEALS 10/25/18   Burchette, Elberta Fortis, MD  PROAIR HFA 108 [phone] Base) MCG/ACT inhaler INHALE 2 PUFFS INTO THE LUNGS EVERY 4 HOURS AS NEEDED FOR WHEEZING  OR SHORTNESS OF BREATH. 12/22/18   Burchette, Elberta Fortis, MD      Allergies    Varenicline tartrate    Review of Systems   Review of Systems  Musculoskeletal:  Positive for back pain.  All other systems reviewed and are negative.   Physical Exam Updated Vital Signs BP (!) 109/50   Pulse (!) 55   Temp 98.6 F (37 C)   Resp 18   SpO2 95%  Physical Exam Vitals and nursing note reviewed.  Constitutional:      General: Tammy [name] is not in acute distress.    Appearance: Normal appearance. Tammy [name] is normal weight. Tammy [name] is not ill-appearing.  HENT:     Head: Normocephalic and atraumatic.  Pulmonary:     Effort: Pulmonary effort is normal. No respiratory distress.  Abdominal:     General: Abdomen is flat.  Musculoskeletal:        General: Normal range of motion.     Cervical back: Neck supple.     Comments: TTP to the musculature of right lumbar back.  No specific midline T or L-spine TTP, step-offs or deformities.  No rashes.  Normal observed gait  Skin:    General: Skin is warm and dry.  Neurological:     Mental Status: Tammy [name] is alert and oriented to person, place, and time.  Psychiatric:        Mood and Affect: Mood normal.        Behavior: Behavior normal.     ED Results / Procedures / Treatments   Labs (all labs ordered are listed, but only abnormal results are displayed) Labs Reviewed  URINALYSIS, ROUTINE W REFLEX MICROSCOPIC - Abnormal; Notable for the following components:      Result Value   Specific Gravity, Urine 1.032 (*)    Protein, ur TRACE (*)    Leukocytes,Ua SMALL (*)    All other components within normal limits    EKG None  Radiology No results found.  Procedures Procedures    Medications Ordered in ED Medications  oxyCODONE-acetaminophen (PERCOCET/ROXICET) 5-325 MG per tablet 1 tablet (1 tablet Oral Given 11/05/23 1426)  LORazepam (ATIVAN) tablet 0.5 mg (0.5 mg Oral Given 11/05/23 1454)    ED Course/ Medical Decision Making/ A&P                                  Medical Decision Making Amount and/or Complexity of Data Reviewed Labs: ordered.  Risk Prescription drug management.  This patient presents to the ED for concern of back pain, this involves an extensive number of treatment options, and is a complaint that carries with it a high risk of complications and morbidity.  The emergent differential diagnosis for back pain includes but is not  limited to fracture, muscle strain, cauda equina, spinal stenosis. DDD, ankylosing spondylitis, acute ligamentous injury, disk herniation, spondylolisthesis, Epidural compression syndrome, metastatic cancer, transverse myelitis, vertebral osteomyelitis, diskitis, kidney stone, pyelonephritis, AAA, Perforated ulcer, Retrocecal appendicitis, pancreatitis, bowel obstruction, retroperitoneal hemorrhage or mass, meningitis.   My initial workup includes pain control, urinalysis  Additional history obtained from: Nursing notes from this visit.  I ordered, reviewed and interpreted labs which include: Urinalysis  Afebrile, hemodynamically stable.  63 year old female presenting to the ED for evaluation of right-sided low back pain.  Has a history of right-sided back pain.  Symptoms began approximately 10 days ago.  Tammy [name] saw orthopedics 3 days ago for this.  Tammy [name] was started on prednisone and Robaxin.  Her symptoms worsened this morning.  No urinary complaints.  Urine without evidence of infection or blood.  Lower suspicion for pyelonephritis or nephrolithiasis.  There is some tenderness to palpation of the musculature of the right lumbar back.  No red flag symptoms to suggest cord compression syndrome.  Tammy [name] was given a dose of Ativan in the emergency department and was able to ambulate without difficulty.  Tammy [name] reports more improvement when taking Flexeril.  Prescription was sent and Tammy [name] was encouraged to take this as needed for pain.  Tammy [name] was educated on potential side effects.  Also requesting hydrocodone as this has  worked for her in the past.  Short course of hydrocodone was sent, Tammy [name] was educated on potential side effects.  Tammy [name] was encouraged to follow-up with her orthopedic provider for continued management.  Tammy [name] was given return precautions.  Stable at discharge.  At this time there does not appear to be any evidence of an acute emergency medical condition and the patient appears stable for discharge with appropriate outpatient follow up. Diagnosis was discussed with patient who verbalizes understanding of care plan and is agreeable to discharge. I have discussed return precautions with patient who verbalizes understanding. Patient encouraged to follow-up with their PCP within 1 week. All questions answered.  Note: Portions of this report may have been transcribed using voice recognition software. Every effort was made to ensure accuracy; however, inadvertent computerized transcription errors may still be present.         Final Clinical Impression(s) / ED Diagnoses Final diagnoses:  Acute right-sided low back pain without sciatica    Rx / DC Orders ED Discharge Orders          Ordered    cyclobenzaprine (FLEXERIL) 10 MG tablet  2 times daily PRN        11/05/23 1643    HYDROcodone-acetaminophen (NORCO/VICODIN) 5-325 MG tablet  Every 6 hours PRN        11/05/23 1643              Mora Bellman 11/05/23 1647    Rondel Baton, MD 11/09/23 1252

## 2023-11-05 NOTE — ED Triage Notes (Addendum)
She c/o mid and lower back pain x ~2 weeks. She saw [facility]. For this this week, at which time "they did x-rays". She denies any trauma. She also was prescribed a steroid and methocarbamol.

## 2023-11-05 NOTE — ED Notes (Addendum)
Pt ambulated about 70 ft. Ambulated with a steady gait. No complaints of extreme pain.

## 2023-11-05 NOTE — ED Notes (Signed)
Pt states she is unable to void, encouraged water intake.

## 2024-01-05 ENCOUNTER — Encounter: Payer: Self-pay | Admitting: Internal Medicine

## 2024-01-24 ENCOUNTER — Encounter

## 2024-02-15 ENCOUNTER — Encounter: Payer: Self-pay | Admitting: Internal Medicine

## 2024-02-15 ENCOUNTER — Ambulatory Visit (AMBULATORY_SURGERY_CENTER)

## 2024-02-15 VITALS — Ht 62.0 in | Wt 138.0 lb

## 2024-02-15 DIAGNOSIS — Z860100 Personal history of colon polyps, unspecified: Secondary | ICD-10-CM

## 2024-02-15 MED ORDER — NA SULFATE-K SULFATE-MG SULF 17.5-3.13-1.6 GM/177ML PO SOLN: 1.0000 | Freq: Once | ORAL | 0 refills | Status: AC

## 2024-02-15 NOTE — Progress Notes (Signed)

## 2024-02-22 ENCOUNTER — Encounter (HOSPITAL_COMMUNITY): Payer: Self-pay

## 2024-02-23 ENCOUNTER — Encounter: Admitting: Internal Medicine

## 2024-02-29 ENCOUNTER — Ambulatory Visit: Admitting: Internal Medicine

## 2024-02-29 ENCOUNTER — Encounter: Payer: Self-pay | Admitting: Internal Medicine

## 2024-02-29 VITALS — BP 135/56 | HR 73 | Temp 98.0°F | Resp 13 | Ht 62.0 in | Wt 138.0 lb

## 2024-02-29 DIAGNOSIS — Z860100 Personal history of colon polyps, unspecified: Secondary | ICD-10-CM

## 2024-02-29 DIAGNOSIS — Z860101 Personal history of adenomatous and serrated colon polyps: Secondary | ICD-10-CM

## 2024-02-29 DIAGNOSIS — D123 Benign neoplasm of transverse colon: Secondary | ICD-10-CM | POA: Diagnosis not present

## 2024-02-29 DIAGNOSIS — Z1211 Encounter for screening for malignant neoplasm of colon: Secondary | ICD-10-CM | POA: Diagnosis present

## 2024-02-29 DIAGNOSIS — K5730 Diverticulosis of large intestine without perforation or abscess without bleeding: Secondary | ICD-10-CM | POA: Diagnosis not present

## 2024-02-29 MED ORDER — SODIUM CHLORIDE 0.9 % IV SOLN: 500.0000 mL | Freq: Once | INTRAVENOUS | Status: DC

## 2024-02-29 NOTE — Progress Notes (Signed)
 GASTROENTEROLOGY PROCEDURE H&P NOTE   Primary Care Physician: Joenathan Muslim, FNP    Reason for Procedure:  History of colonic polyps  Plan:    Colonoscopy  Patient is appropriate for endoscopic procedure(s) in the ambulatory ([facility]) setting.  The nature of the procedure, as well as the risks, benefits, and alternatives were carefully and thoroughly reviewed with the patient. Ample time for discussion and questions allowed. The patient understood, was satisfied, and agreed to proceed.     HPI: Tammy [name] is a 63 y.o. female who presents for surveillance colonoscopy.  Medical history as below.  Tolerated the prep.  No recent chest pain or shortness of breath.  No abdominal pain today.  Past Medical History:  Diagnosis Date   Allergy    History of angina 2006   HYPERLIPIDEMIA 02/22/2008   HYPERTENSION 02/22/2008   Kidney stones    Smoker     Past Surgical History:  Procedure Laterality Date   ENDOMETRIAL ABLATION     INDUCED ABORTION  age 36   KIDNEY STONE SURGERY  2011    [facility], right percutaneous   lap     TUBAL LIGATION  2006    Prior to Admission medications   Medication Sig Start Date End Date Taking? Authorizing Provider  atorvastatin  (LIPITOR) 20 MG tablet TAKE 1 TABLET BY MOUTH EVERY DAY 07/02/19  Yes Burchette, Marijean Shouts, MD  cholecalciferol (VITAMIN D3) 25 MCG (1000 UNIT) tablet Take 1,000 Units by mouth daily.   Yes [provider]  fexofenadine (ALLEGRA) 180 MG tablet Take 180 mg by mouth daily.   Yes [provider]  fluticasone  (FLONASE ) 50 MCG/ACT nasal spray Place 1 spray into both nostrils daily as needed. 09/14/13  Yes Swords, Dorathy Gals, MD  gabapentin  (NEURONTIN ) 100 MG capsule TAKE 2 CAPSULES BY MOUTH 3 TIMES A DAY 06/11/19  Yes Burchette, Marijean Shouts, MD  metoprolol  tartrate (LOPRESSOR ) 50 MG tablet TAKE 1 TABLET BY MOUTH TWICE A DAY 01/03/19  Yes Burchette, Marijean Shouts, MD  potassium citrate  (UROCIT-K ) 10 MEQ (1080 MG) SR  tablet TAKE 1 TABLET BY MOUTH 3 TIMES A DAY WITH MEALS 10/25/18  Yes Burchette, Marijean Shouts, MD  cyanocobalamin (VITAMIN B12) 1000 MCG/ML injection Inject into the muscle once a week. 01/09/24   [provider]  pantoprazole  (PROTONIX ) 20 MG tablet Take 1 tablet (20 mg total) by mouth daily for 14 days. Patient not taking: Reported on 02/29/2024 02/13/23 02/15/24  Lowery Rue, DO  PROAIR  HFA 108 (90 Base) MCG/ACT inhaler INHALE 2 PUFFS INTO THE LUNGS EVERY 4 HOURS AS NEEDED FOR WHEEZING OR SHORTNESS OF BREATH. 12/22/18   Burchette, Marijean Shouts, MD    Current Outpatient Medications  Medication Sig Dispense Refill   atorvastatin  (LIPITOR) 20 MG tablet TAKE 1 TABLET BY MOUTH EVERY DAY 90 tablet 0   cholecalciferol (VITAMIN D3) 25 MCG (1000 UNIT) tablet Take 1,000 Units by mouth daily.     fexofenadine (ALLEGRA) 180 MG tablet Take 180 mg by mouth daily.     fluticasone  (FLONASE ) 50 MCG/ACT nasal spray Place 1 spray into both nostrils daily as needed. 16 g 2   gabapentin  (NEURONTIN ) 100 MG capsule TAKE 2 CAPSULES BY MOUTH 3 TIMES A DAY 180 capsule 0   metoprolol  tartrate (LOPRESSOR ) 50 MG tablet TAKE 1 TABLET BY MOUTH TWICE A DAY 180 tablet 0   potassium citrate  (UROCIT-K ) 10 MEQ (1080 MG) SR tablet TAKE 1 TABLET BY MOUTH 3 TIMES A DAY WITH MEALS  270 tablet 0   cyanocobalamin (VITAMIN B12) 1000 MCG/ML injection Inject into the muscle once a week.     pantoprazole  (PROTONIX ) 20 MG tablet Take 1 tablet (20 mg total) by mouth daily for 14 days. (Patient not taking: Reported on 02/29/2024) 14 tablet 0   PROAIR  HFA 108 (90 Base) MCG/ACT inhaler INHALE 2 PUFFS INTO THE LUNGS EVERY 4 HOURS AS NEEDED FOR WHEEZING OR SHORTNESS OF BREATH. 8.5 Inhaler 0   Current Facility-Administered Medications  Medication Dose Route Frequency Provider Last Rate Last Admin   0.9 %  sodium chloride  infusion  500 mL Intravenous Once Demtrius Rounds, Amber Bail, MD        Allergies as of 02/29/2024 - Review Complete 02/29/2024  Allergen  Reaction Noted   Varenicline tartrate Other (See Comments) 07/21/2012    Family History  Problem Relation Age of Onset   Cirrhosis Mother 4       non alcoholic   Cancer Father 32       bladder   Colon cancer Neg Hx    Pancreatic cancer Neg Hx    Stomach cancer Neg Hx    Esophageal cancer Neg Hx    Rectal cancer Neg Hx     Social History   Socioeconomic History   Marital status: Married    Spouse name: Not on file   Number of children: Not on file   Years of education: Not on file   Highest education level: Not on file  Occupational History   Not on file  Tobacco Use   Smoking status: Every Day    Current packs/day: 1.00    Average packs/day: 1 pack/day for 30.0 years (30.0 ttl pk-yrs)    Types: Cigarettes   Smokeless tobacco: Never  Vaping Use   Vaping status: Former  Substance and Sexual Activity   Alcohol use: No    Alcohol/week: 0.0 standard drinks of alcohol   Drug use: No   Sexual activity: Not on file  Other Topics Concern   Not on file  Social History Narrative   Not on file   Social Drivers of Health   Financial Resource Strain: Low Risk  (10/27/2020)   Received from [facility] visits prior to 12/18/2022., [facility] visits prior to 12/18/2022.   Overall Financial Resource Strain (CARDIA)    Difficulty of Paying Living Expenses: Not hard at all  Food Insecurity: Low Risk  (05/16/2023)   [facility]   Hunger Vital Sign    Worried About Running Out of Food in the Last Year: Never true    Ran Out of Food in the Last Year: Never true  Transportation Needs: No Transportation Needs (05/16/2023)   Received from [facility]    In the past 12 months, has lack of reliable transportation kept you from medical appointments, meetings, work or from getting things needed for daily living? : No  Physical Activity: Inactive (10/27/2020)   Received from [facility] visits  prior to 12/18/2022., [facility] visits prior to 12/18/2022.   Exercise Vital Sign    Days of Exercise per Week: 0 days    Minutes of Exercise per Session: 0 min  Stress: No Stress Concern Present (10/27/2020)   [facility] prior to 12/18/2022., [facility] visits prior to 12/18/2022.   [employer] of Occupational Health - Occupational Stress Questionnaire  Feeling of Stress : Not at all  Social Connections: Socially Integrated (10/27/2020)   Received from [facility] visits prior to 12/18/2022., [facility] visits prior to 12/18/2022.   Social Advertising account executive [NHANES]    Frequency of Communication with Friends and Family: More than three times a week    Frequency of Social Gatherings with Friends and Family: More than three times a week    Attends Religious Services: More than 4 times per year    Active Member of [employer] or Organizations: Yes    Attends Banker Meetings: More than 4 times per year    Marital Status: Married  Catering manager Violence: Not At Risk (10/27/2020)   Received from [facility] visits prior to 12/18/2022., [facility] [place] visits prior to 12/18/2022.   Humiliation, Afraid, Rape, and Kick questionnaire    Fear of Current or Ex-Partner: No    Emotionally Abused: No    Physically Abused: No    Sexually Abused: No    Physical Exam: Vital signs in last 24 hours: @BP  (!) 157/82   Pulse 67   Temp 98 F (36.7 C) (Skin)   Ht 5\' 2"  (1.575 m)   Wt 138 lb (62.6 kg)   SpO2 98%   BMI 25.24 kg/m  GEN: NAD EYE: Sclerae anicteric ENT: MMM CV: Non-tachycardic Pulm: CTA b/l GI: Soft, NT/ND NEURO:  Alert & Oriented x 3   Laurell Pond, MD [facility]  02/29/2024 8:57 AM

## 2024-02-29 NOTE — Patient Instructions (Addendum)
 Resume previous diet.                           - Continue present medications.                           - Await pathology results.                           - Repeat colonoscopy is recommended for                            surveillance. The colonoscopy date will be                            determined after pathology results from today's                            exam become available for review  Handout on polyps and diverticulosis given.      YOU HAD AN ENDOSCOPIC PROCEDURE TODAY AT THE [facility]:   Refer to the procedure report that was given to you for any specific questions about what was found during the examination.  If the procedure report does not answer your questions, please call your gastroenterologist to clarify.  If you requested that your care partner not be given the details of your procedure findings, then the procedure report has been included in a sealed envelope for you to review at your convenience later.  YOU SHOULD EXPECT: Some feelings of bloating in the abdomen. Passage of more gas than usual.  Walking can help get rid of the air that was put into your GI tract during the procedure and reduce the bloating. If you had a lower endoscopy (such as a colonoscopy or flexible sigmoidoscopy) you may notice spotting of blood in your stool or on the toilet paper. If you underwent a bowel prep for your procedure, you may not have a normal bowel movement for a few days.  Please Note:  You might notice some irritation and congestion in your nose or some drainage.  This is from the oxygen used during your procedure.  There is no need for concern and it should clear up in a day or so.  SYMPTOMS TO REPORT IMMEDIATELY:  Following lower endoscopy (colonoscopy or flexible sigmoidoscopy):  Excessive amounts of blood in the stool  Significant tenderness or worsening of abdominal pains  Swelling of the abdomen that is new, acute  Fever of 100F or higher   For  urgent or emergent issues, a gastroenterologist can be reached at any hour by calling [phone]. Do not use MyChart messaging for urgent concerns.    DIET:  We do recommend a small meal at first, but then you may proceed to your regular diet.  Drink plenty of fluids but you should avoid alcoholic beverages for 24 hours.  ACTIVITY:  You should plan to take it easy for the rest of today and you should NOT DRIVE or use heavy machinery until tomorrow (because of the sedation medicines used during the test).    FOLLOW UP: Our staff will call the number listed on your records the next business day following your procedure.  We will call around 7:15- 8:00 am to check on you  and address any questions or concerns that you may have regarding the information given to you following your procedure. If we do not reach you, we will leave a message.     If any biopsies were taken you will be contacted by phone or by letter within the next 1-3 weeks.  Please call us at [phone] if you have not heard about the biopsies in 3 weeks.    SIGNATURES/CONFIDENTIALITY: You and/or your care partner have signed paperwork which will be entered into your electronic medical record.  These signatures attest to the fact that that the information above on your After Visit Summary has been reviewed and is understood.  Full responsibility of the confidentiality of this discharge information lies with you and/or your care-partner.

## 2024-02-29 NOTE — Progress Notes (Signed)
 Pt's states no medical or surgical changes since previsit or office visit.

## 2024-02-29 NOTE — Op Note (Signed)
 [facility] Patient Name: Tammy [name] Procedure Date: 02/29/2024 8:53 AM MRN: [id number] Endoscopist: Nannette Babe , MD, [id number] Age: 63 Referring MD:  Date of Birth: 01-31-1961 Gender: Female Account #: [id number] Procedure:                Colonoscopy Indications:              High risk colon cancer surveillance: Personal                            history of multiple adenomas, Last colonoscopy:                            September 2019 (TA x 2), March 2015 (TA and SSP) Medicines:                Monitored Anesthesia Care Procedure:                Pre-Anesthesia Assessment:                           - Prior to the procedure, a History and Physical                            was performed, and patient medications and                            allergies were reviewed. The patient's tolerance of                            previous anesthesia was also reviewed. The risks                            and benefits of the procedure and the sedation                            options and risks were discussed with the patient.                            All questions were answered, and informed consent                            was obtained. Prior Anticoagulants: The patient has                            taken no anticoagulant or antiplatelet agents. ASA                            Grade Assessment: II - A patient with mild systemic                            disease. After reviewing the risks and benefits,                            the patient was deemed in satisfactory condition to  undergo the procedure.                           After obtaining informed consent, the colonoscope                            was passed under direct vision. Throughout the                            procedure, the patient's blood pressure, pulse, and                            oxygen saturations were monitored continuously. The                            Olympus Scope  SN [id number] was introduced through the                            anus and advanced to the cecum, identified by                            appendiceal orifice and ileocecal valve. The                            colonoscopy was performed without difficulty. The                            patient tolerated the procedure well. The quality                            of the bowel preparation was good. The ileocecal                            valve, appendiceal orifice, and rectum were                            photographed. Scope In: 9:04:34 AM Scope Out: 9:18:47 AM Scope Withdrawal Time: 0 hours 11 minutes 36 seconds  Total Procedure Duration: 0 hours 14 minutes 13 seconds  Findings:                 The digital rectal exam was normal.                           Four sessile polyps were found in the transverse                            colon. The polyps were 3 to 6 mm in size. These                            polyps were removed with a cold snare. Resection                            and retrieval were complete.  Multiple medium-mouthed and small-mouthed                            diverticula were found in the sigmoid colon and                            descending colon.                           The retroflexed view of the distal rectum and anal                            verge was normal and showed no anal or rectal                            abnormalities. Complications:            No immediate complications. Estimated Blood Loss:     Estimated blood loss: none. Impression:               - Four 3 to 6 mm polyps in the transverse colon,                            removed with a cold snare. Resected and retrieved.                           - Mild diverticulosis in the sigmoid colon and in                            the descending colon.                           - The distal rectum and anal verge are normal on                            retroflexion  view. Recommendation:           - Patient has a contact number available for                            emergencies. The signs and symptoms of potential                            delayed complications were discussed with the                            patient. Return to normal activities tomorrow.                            Written discharge instructions were provided to the                            patient.                           - Resume previous diet.                           -  Continue present medications.                           - Await pathology results.                           - Repeat colonoscopy is recommended for                            surveillance. The colonoscopy date will be                            determined after pathology results from today's                            exam become available for review. Nannette Babe, MD 02/29/2024 9:20:47 AM This report has been signed electronically.

## 2024-02-29 NOTE — Progress Notes (Signed)
 Called to room to assist during endoscopic procedure.  Patient ID and intended procedure confirmed with present staff. Received instructions for my participation in the procedure from the performing physician.

## 2024-02-29 NOTE — Progress Notes (Signed)
 Report to PACU, RN, vss, BBS= Clear.

## 2024-03-01 ENCOUNTER — Telehealth: Payer: Self-pay

## 2024-03-01 NOTE — Telephone Encounter (Signed)
 Follow up call to pt, no answer.

## 2024-03-06 LAB — SURGICAL PATHOLOGY

## 2024-03-07 ENCOUNTER — Other Ambulatory Visit: Payer: Self-pay | Admitting: Family Medicine

## 2024-03-07 DIAGNOSIS — Z1231 Encounter for screening mammogram for malignant neoplasm of breast: Secondary | ICD-10-CM

## 2024-03-08 ENCOUNTER — Ambulatory Visit: Payer: Self-pay | Admitting: Internal Medicine

## 2024-03-13 ENCOUNTER — Encounter (HOSPITAL_BASED_OUTPATIENT_CLINIC_OR_DEPARTMENT_OTHER): Payer: Self-pay | Admitting: Radiology

## 2024-03-13 ENCOUNTER — Ambulatory Visit (HOSPITAL_BASED_OUTPATIENT_CLINIC_OR_DEPARTMENT_OTHER)
Admission: RE | Admit: 2024-03-13 | Discharge: 2024-03-13 | Disposition: A | Discharge status: Home / Self Care | Source: Ambulatory Visit | Attending: Family Medicine | Admitting: Family Medicine

## 2024-03-13 DIAGNOSIS — Z1231 Encounter for screening mammogram for malignant neoplasm of breast: Secondary | ICD-10-CM | POA: Diagnosis present

## 2024-10-04 ENCOUNTER — Ambulatory Visit: Admitting: Podiatry

## 2024-10-10 ENCOUNTER — Encounter: Payer: Self-pay | Admitting: Podiatry

## 2024-10-10 ENCOUNTER — Ambulatory Visit (INDEPENDENT_AMBULATORY_CARE_PROVIDER_SITE_OTHER)

## 2024-10-10 ENCOUNTER — Ambulatory Visit: Admitting: Podiatry

## 2024-10-10 DIAGNOSIS — M2041 Other hammer toe(s) (acquired), right foot: Secondary | ICD-10-CM | POA: Diagnosis not present

## 2024-10-10 DIAGNOSIS — D3610 Benign neoplasm of peripheral nerves and autonomic nervous system, unspecified: Secondary | ICD-10-CM | POA: Diagnosis not present

## 2024-10-10 DIAGNOSIS — M2042 Other hammer toe(s) (acquired), left foot: Secondary | ICD-10-CM

## 2024-10-10 DIAGNOSIS — L600 Ingrowing nail: Secondary | ICD-10-CM | POA: Diagnosis not present

## 2024-10-10 MED ORDER — TRIAMCINOLONE ACETONIDE 10 MG/ML IJ SUSP
10.0000 mg | Freq: Once | INTRAMUSCULAR | Status: AC
  Administered 2024-10-10: 10 mg via INTRA_ARTICULAR

## 2024-10-10 NOTE — Patient Instructions (Signed)

## 2024-10-10 NOTE — Progress Notes (Signed)
 Subjective:   Patient ID: Verneita LELON Cough, female   DOB: 63 y.o.   MRN: [id number]   HPI Patient presents with caregiver with chronic painful ingrown toenail medial right over left hallux and discomfort with shooting pain third interspace extending into the digits right over left foot.  States this has been going on for a fairly long period of time gradually getting worse and harder to be active.  Patient does smoke a pack of cigarettes per day and tries to be active   Review of Systems  All other systems reviewed and are negative.       Objective:  Physical Exam Vitals and nursing note reviewed.  Constitutional:      Appearance: She is well-developed.  Pulmonary:     Effort: Pulmonary effort is normal.  Musculoskeletal:        General: Normal range of motion.  Skin:    General: Skin is warm.  Neurological:     Mental Status: She is alert.     Neurovascular status found to be intact muscle strength found to be adequate range of motion adequate with incurvated medial border of the hallux bilateral with slight redness no active drainage noted and quite a bit of pain.  Patient's third interspace right has a positive Mulder sign with shooting pain and does have digital deformity of the fourth digit bilateral also positive Mulder sign left.  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 along with possibility for neuroma symptomatology right over left and hammertoe deformity fourth digit bilateral that may be symptomatic     Plan:  H&P reviewed all conditions recommended correction of ingrown toenail explained procedure risk patient wants surgery signed consent form and I infiltrated each big toe 60 mg Xylocaine  Marcaine mixture sterile prep done using sterile instrumentation removed the medial border right and left hallux exposed matrix applied phenol 3 applications 30 seconds followed  by alcohol lavage sterile dressing gave instructions on soaks wear dressing 24 hours take it off earlier if throbbing were to occur and encouraged to call with questions concerns.  For the right I went ahead and I did sterile prep I injected the third interspace with a combination of Marcaine Kenalog  and dexamethasone and I want to see the response over the next several weeks and it may possibly require excision  X-rays indicate no signs of arthritis no signs of stress fracture appears to be inflammatory

## 2024-10-25 ENCOUNTER — Ambulatory Visit: Admitting: Podiatry

## 2024-10-25 ENCOUNTER — Encounter: Payer: Self-pay | Admitting: Podiatry

## 2024-10-25 VITALS — Ht 62.0 in | Wt 138.0 lb

## 2024-10-25 DIAGNOSIS — M2041 Other hammer toe(s) (acquired), right foot: Secondary | ICD-10-CM | POA: Diagnosis not present

## 2024-10-25 DIAGNOSIS — L600 Ingrowing nail: Secondary | ICD-10-CM

## 2024-10-25 DIAGNOSIS — M2042 Other hammer toe(s) (acquired), left foot: Secondary | ICD-10-CM | POA: Diagnosis not present

## 2024-10-25 DIAGNOSIS — D3610 Benign neoplasm of peripheral nerves and autonomic nervous system, unspecified: Secondary | ICD-10-CM

## 2024-10-25 NOTE — Patient Instructions (Signed)

## 2024-10-26 NOTE — Progress Notes (Signed)
 Subjective:   Patient ID: Verneita LELON Cough, female   DOB: 64 y.o.   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  Made her aware of this and recovered
